# Patient Record
Sex: Female | Born: 1955
Health system: Southern US, Community
[De-identification: ages and names within clinical notes are randomized; demographics above are authoritative.]

## PROBLEM LIST (undated history)

## (undated) DIAGNOSIS — N201 Calculus of ureter: Secondary | ICD-10-CM

## (undated) DIAGNOSIS — M65331 Trigger finger, right middle finger: Secondary | ICD-10-CM

## (undated) DIAGNOSIS — R3915 Urgency of urination: Secondary | ICD-10-CM

## (undated) DIAGNOSIS — Z972 Presence of dental prosthetic device (complete) (partial): Secondary | ICD-10-CM

## (undated) DIAGNOSIS — F32A Depression, unspecified: Secondary | ICD-10-CM

## (undated) DIAGNOSIS — E785 Hyperlipidemia, unspecified: Secondary | ICD-10-CM

## (undated) DIAGNOSIS — F329 Major depressive disorder, single episode, unspecified: Secondary | ICD-10-CM

## (undated) DIAGNOSIS — Z9889 Other specified postprocedural states: Secondary | ICD-10-CM

## (undated) DIAGNOSIS — N200 Calculus of kidney: Secondary | ICD-10-CM

## (undated) DIAGNOSIS — M1990 Unspecified osteoarthritis, unspecified site: Secondary | ICD-10-CM

## (undated) DIAGNOSIS — R030 Elevated blood-pressure reading, without diagnosis of hypertension: Secondary | ICD-10-CM

## (undated) DIAGNOSIS — I10 Essential (primary) hypertension: Secondary | ICD-10-CM

## (undated) DIAGNOSIS — R112 Nausea with vomiting, unspecified: Secondary | ICD-10-CM

## (undated) DIAGNOSIS — M8580 Other specified disorders of bone density and structure, unspecified site: Secondary | ICD-10-CM

## (undated) DIAGNOSIS — N182 Chronic kidney disease, stage 2 (mild): Secondary | ICD-10-CM

## (undated) DIAGNOSIS — D649 Anemia, unspecified: Secondary | ICD-10-CM

## (undated) DIAGNOSIS — Z973 Presence of spectacles and contact lenses: Secondary | ICD-10-CM

## (undated) DIAGNOSIS — G5603 Carpal tunnel syndrome, bilateral upper limbs: Secondary | ICD-10-CM

## (undated) DIAGNOSIS — K5190 Ulcerative colitis, unspecified, without complications: Secondary | ICD-10-CM

## (undated) HISTORY — PX: OTHER SURGICAL HISTORY: SHX169

---

## 1976-10-17 HISTORY — PX: TUBAL LIGATION: SHX77

## 1999-04-26 ENCOUNTER — Other Ambulatory Visit: Admission: RE | Admit: 1999-04-26 | Discharge: 1999-04-26 | Payer: Self-pay | Admitting: Obstetrics and Gynecology

## 1999-06-09 ENCOUNTER — Other Ambulatory Visit: Admission: RE | Admit: 1999-06-09 | Discharge: 1999-06-09 | Payer: Self-pay | Admitting: Obstetrics and Gynecology

## 1999-06-30 ENCOUNTER — Ambulatory Visit (HOSPITAL_COMMUNITY): Admission: RE | Admit: 1999-06-30 | Discharge: 1999-06-30 | Payer: Self-pay | Admitting: Gastroenterology

## 2000-10-30 ENCOUNTER — Other Ambulatory Visit: Admission: RE | Admit: 2000-10-30 | Discharge: 2000-10-30 | Payer: Self-pay | Admitting: Obstetrics and Gynecology

## 2000-11-10 ENCOUNTER — Ambulatory Visit (HOSPITAL_COMMUNITY): Admission: RE | Admit: 2000-11-10 | Discharge: 2000-11-10 | Payer: Self-pay | Admitting: Gastroenterology

## 2002-02-20 ENCOUNTER — Other Ambulatory Visit: Admission: RE | Admit: 2002-02-20 | Discharge: 2002-02-20 | Payer: Self-pay | Admitting: Obstetrics & Gynecology

## 2003-05-20 ENCOUNTER — Other Ambulatory Visit: Admission: RE | Admit: 2003-05-20 | Discharge: 2003-05-20 | Payer: Self-pay | Admitting: Obstetrics & Gynecology

## 2003-10-18 HISTORY — PX: COLOSTOMY TAKEDOWN: SHX5783

## 2004-12-13 ENCOUNTER — Other Ambulatory Visit: Admission: RE | Admit: 2004-12-13 | Discharge: 2004-12-13 | Payer: Self-pay | Admitting: Obstetrics & Gynecology

## 2006-01-25 ENCOUNTER — Other Ambulatory Visit: Admission: RE | Admit: 2006-01-25 | Discharge: 2006-01-25 | Payer: Self-pay | Admitting: Obstetrics & Gynecology

## 2013-06-13 ENCOUNTER — Other Ambulatory Visit: Payer: Self-pay

## 2015-07-02 ENCOUNTER — Other Ambulatory Visit: Payer: Self-pay | Admitting: Obstetrics & Gynecology

## 2015-07-02 DIAGNOSIS — N6310 Unspecified lump in the right breast, unspecified quadrant: Secondary | ICD-10-CM

## 2015-07-07 ENCOUNTER — Ambulatory Visit
Admission: RE | Admit: 2015-07-07 | Discharge: 2015-07-07 | Disposition: A | Discharge status: Home / Self Care | Payer: 59 | Source: Ambulatory Visit | Attending: Obstetrics & Gynecology | Admitting: Obstetrics & Gynecology

## 2015-07-07 DIAGNOSIS — N6310 Unspecified lump in the right breast, unspecified quadrant: Secondary | ICD-10-CM

## 2016-05-10 ENCOUNTER — Emergency Department (HOSPITAL_BASED_OUTPATIENT_CLINIC_OR_DEPARTMENT_OTHER): Payer: 59

## 2016-05-10 ENCOUNTER — Encounter (HOSPITAL_BASED_OUTPATIENT_CLINIC_OR_DEPARTMENT_OTHER): Payer: Self-pay | Admitting: *Deleted

## 2016-05-10 ENCOUNTER — Inpatient Hospital Stay (HOSPITAL_BASED_OUTPATIENT_CLINIC_OR_DEPARTMENT_OTHER)
Admission: EM | Admit: 2016-05-10 | Discharge: 2016-05-18 | DRG: 357 | Disposition: A | Discharge status: Home / Self Care | Payer: 59 | Attending: Internal Medicine | Admitting: Internal Medicine

## 2016-05-10 DIAGNOSIS — N201 Calculus of ureter: Secondary | ICD-10-CM

## 2016-05-10 DIAGNOSIS — K567 Ileus, unspecified: Secondary | ICD-10-CM | POA: Diagnosis not present

## 2016-05-10 DIAGNOSIS — Z800 Family history of malignant neoplasm of digestive organs: Secondary | ICD-10-CM

## 2016-05-10 DIAGNOSIS — I129 Hypertensive chronic kidney disease with stage 1 through stage 4 chronic kidney disease, or unspecified chronic kidney disease: Secondary | ICD-10-CM | POA: Diagnosis present

## 2016-05-10 DIAGNOSIS — E785 Hyperlipidemia, unspecified: Secondary | ICD-10-CM | POA: Diagnosis present

## 2016-05-10 DIAGNOSIS — K562 Volvulus: Secondary | ICD-10-CM | POA: Diagnosis not present

## 2016-05-10 DIAGNOSIS — G5600 Carpal tunnel syndrome, unspecified upper limb: Secondary | ICD-10-CM | POA: Diagnosis present

## 2016-05-10 DIAGNOSIS — K5190 Ulcerative colitis, unspecified, without complications: Secondary | ICD-10-CM | POA: Diagnosis present

## 2016-05-10 DIAGNOSIS — K9189 Other postprocedural complications and disorders of digestive system: Secondary | ICD-10-CM | POA: Diagnosis not present

## 2016-05-10 DIAGNOSIS — N132 Hydronephrosis with renal and ureteral calculous obstruction: Secondary | ICD-10-CM | POA: Diagnosis present

## 2016-05-10 DIAGNOSIS — R1032 Left lower quadrant pain: Secondary | ICD-10-CM

## 2016-05-10 DIAGNOSIS — R109 Unspecified abdominal pain: Secondary | ICD-10-CM

## 2016-05-10 DIAGNOSIS — R1011 Right upper quadrant pain: Secondary | ICD-10-CM | POA: Diagnosis not present

## 2016-05-10 DIAGNOSIS — I10 Essential (primary) hypertension: Secondary | ICD-10-CM | POA: Diagnosis present

## 2016-05-10 DIAGNOSIS — Z79899 Other long term (current) drug therapy: Secondary | ICD-10-CM

## 2016-05-10 DIAGNOSIS — Z9049 Acquired absence of other specified parts of digestive tract: Secondary | ICD-10-CM

## 2016-05-10 DIAGNOSIS — M8580 Other specified disorders of bone density and structure, unspecified site: Secondary | ICD-10-CM | POA: Diagnosis present

## 2016-05-10 DIAGNOSIS — N211 Calculus in urethra: Secondary | ICD-10-CM | POA: Diagnosis present

## 2016-05-10 DIAGNOSIS — Z8719 Personal history of other diseases of the digestive system: Secondary | ICD-10-CM

## 2016-05-10 DIAGNOSIS — Z8049 Family history of malignant neoplasm of other genital organs: Secondary | ICD-10-CM

## 2016-05-10 DIAGNOSIS — F329 Major depressive disorder, single episode, unspecified: Secondary | ICD-10-CM | POA: Diagnosis present

## 2016-05-10 DIAGNOSIS — N182 Chronic kidney disease, stage 2 (mild): Secondary | ICD-10-CM | POA: Diagnosis present

## 2016-05-10 DIAGNOSIS — M7989 Other specified soft tissue disorders: Secondary | ICD-10-CM

## 2016-05-10 DIAGNOSIS — F419 Anxiety disorder, unspecified: Secondary | ICD-10-CM | POA: Diagnosis present

## 2016-05-10 HISTORY — DX: Major depressive disorder, single episode, unspecified: F32.9

## 2016-05-10 HISTORY — DX: Chronic kidney disease, stage 2 (mild): N18.2

## 2016-05-10 HISTORY — DX: Depression, unspecified: F32.A

## 2016-05-10 HISTORY — DX: Ulcerative colitis, unspecified, without complications: K51.90

## 2016-05-10 HISTORY — DX: Essential (primary) hypertension: I10

## 2016-05-10 HISTORY — DX: Other specified disorders of bone density and structure, unspecified site: M85.80

## 2016-05-10 LAB — CBC WITH DIFFERENTIAL/PLATELET
BASOS ABS: 0 10*3/uL (ref 0.0–0.1)
Basophils Relative: 0 %
EOS PCT: 0 %
Eosinophils Absolute: 0 10*3/uL (ref 0.0–0.7)
HCT: 40.4 % (ref 36.0–46.0)
Hemoglobin: 13.6 g/dL (ref 12.0–15.0)
LYMPHS PCT: 6 %
Lymphs Abs: 0.9 10*3/uL (ref 0.7–4.0)
MCH: 32 pg (ref 26.0–34.0)
MCHC: 33.7 g/dL (ref 30.0–36.0)
MCV: 95.1 fL (ref 78.0–100.0)
MONO ABS: 1.2 10*3/uL — AB (ref 0.1–1.0)
MONOS PCT: 9 %
Neutro Abs: 11.3 10*3/uL — ABNORMAL HIGH (ref 1.7–7.7)
Neutrophils Relative %: 85 %
PLATELETS: 224 10*3/uL (ref 150–400)
RBC: 4.25 MIL/uL (ref 3.87–5.11)
RDW: 13.4 % (ref 11.5–15.5)
WBC: 13.4 10*3/uL — ABNORMAL HIGH (ref 4.0–10.5)

## 2016-05-10 LAB — COMPREHENSIVE METABOLIC PANEL
ALT: 18 U/L (ref 14–54)
ANION GAP: 10 (ref 5–15)
AST: 25 U/L (ref 15–41)
Albumin: 4.2 g/dL (ref 3.5–5.0)
Alkaline Phosphatase: 97 U/L (ref 38–126)
BILIRUBIN TOTAL: 1.4 mg/dL — AB (ref 0.3–1.2)
BUN: 17 mg/dL (ref 6–20)
CHLORIDE: 102 mmol/L (ref 101–111)
CO2: 25 mmol/L (ref 22–32)
Calcium: 9.5 mg/dL (ref 8.9–10.3)
Creatinine, Ser: 1.37 mg/dL — ABNORMAL HIGH (ref 0.44–1.00)
GFR, EST AFRICAN AMERICAN: 48 mL/min — AB (ref >60)
GFR, EST NON AFRICAN AMERICAN: 41 mL/min — AB (ref >60)
Glucose, Bld: 121 mg/dL — ABNORMAL HIGH (ref 65–99)
POTASSIUM: 4.1 mmol/L (ref 3.5–5.1)
Sodium: 137 mmol/L (ref 135–145)
TOTAL PROTEIN: 8.4 g/dL — AB (ref 6.5–8.1)

## 2016-05-10 LAB — URINALYSIS, ROUTINE W REFLEX MICROSCOPIC
Bilirubin Urine: NEGATIVE
Glucose, UA: NEGATIVE mg/dL
KETONES UR: NEGATIVE mg/dL
NITRITE: NEGATIVE
PROTEIN: NEGATIVE mg/dL
Specific Gravity, Urine: 1.016 (ref 1.005–1.030)
pH: 5 (ref 5.0–8.0)

## 2016-05-10 LAB — URINE MICROSCOPIC-ADD ON

## 2016-05-10 LAB — LIPASE, BLOOD: LIPASE: 22 U/L (ref 11–51)

## 2016-05-10 LAB — I-STAT CG4 LACTIC ACID, ED: Lactic Acid, Venous: 0.49 mmol/L — ABNORMAL LOW (ref 0.5–1.9)

## 2016-05-10 MED ORDER — SODIUM CHLORIDE 0.9 % IV BOLUS (SEPSIS)
1000.0000 mL | Freq: Once | INTRAVENOUS | Status: AC
  Administered 2016-05-10: 1000 mL via INTRAVENOUS

## 2016-05-10 MED ORDER — MORPHINE SULFATE (PF) 4 MG/ML IV SOLN
4.0000 mg | INTRAVENOUS | Status: AC | PRN
  Administered 2016-05-10 (×3): 4 mg via INTRAVENOUS
  Filled 2016-05-10 (×3): qty 1

## 2016-05-10 MED ORDER — SODIUM CHLORIDE 0.9 % IV SOLN
Freq: Once | INTRAVENOUS | Status: AC
  Administered 2016-05-10: 19:00:00 via INTRAVENOUS

## 2016-05-10 MED ORDER — IOPAMIDOL (ISOVUE-300) INJECTION 61%
80.0000 mL | Freq: Once | INTRAVENOUS | Status: AC | PRN
  Administered 2016-05-10: 80 mL via INTRAVENOUS

## 2016-05-10 MED ORDER — HYDROMORPHONE HCL 1 MG/ML IJ SOLN
1.0000 mg | INTRAMUSCULAR | Status: DC | PRN
  Administered 2016-05-11: 1 mg via INTRAVENOUS
  Filled 2016-05-10: qty 1

## 2016-05-10 MED ORDER — ONDANSETRON HCL 4 MG/2ML IJ SOLN
4.0000 mg | Freq: Once | INTRAMUSCULAR | Status: AC
  Administered 2016-05-10: 4 mg via INTRAVENOUS
  Filled 2016-05-10: qty 2

## 2016-05-10 NOTE — ED Triage Notes (Addendum)
C/o abd pain, n/v since Sunday-NAD-steady gait-sent from [facility]CP-states she was sent to r/o bowel obstruction-last BM today

## 2016-05-10 NOTE — ED Provider Notes (Signed)
[place] DEPT [facility] Provider Note   CSN: [id number] Arrival date & time: 05/10/16  1739  First Provider Contact:  None    By signing my name below, I, Evelene Croon, attest that this documentation has been prepared under the direction and in the presence of Tanna Furry, MD . Electronically Signed: Evelene Croon, Scribe. 05/10/2016. 7:04 PM.  History   Chief Complaint Chief Complaint  Patient presents with  . Abdominal Pain    The history is provided by the patient. No language interpreter was used.    HPI Comments:  Leslie Harrison is a 60 y.o. female who presents to the Emergency Department complaining of waxing and waning abdominal pain which began 3 days ago; worsened today. Pt notes her pain began in the RUQ but has moved to her left mid abdomen today. Pt reports associated nausea and vomiting. She also noted low grade fever this AM with TMAX of 100.2. Pt denies blood in her stool and vomit Pt has a h/o Ulcerative colitis, h/o colectomy and a h/o  ileostomy and subsequent reversal. Pt states she hasn't had any issues since this was done in 2004. No alleviating factors noted. Pt was sent by her PCP at [facility] to [place]    Past Medical History:  Diagnosis Date  . Carpal tunnel syndrome   . Depression   . Hyperlipemia   . Hypertension   . Kidney disease, chronic, stage II (mild, EGFR 60+ ml/min)   . Osteopenia   . Ulcerative colitis ([place])     There are no active problems to display for this patient.   Past Surgical History:  Procedure Laterality Date  . ABDOMINOPERINEAL PROCTOCOLECTOMY    . COLECTOMY      OB History    No data available       Home Medications    Prior to Admission medications   Medication Sig Start Date End Date Taking? Authorizing Provider  atorvastatin (LIPITOR) 10 MG tablet Take 10 mg by mouth daily.   Yes Historical Provider, MD  Ergocalciferol 2500 units CAPS Take 5,000 Units by mouth 2 (two) times a week.   Yes Historical  Provider, MD  Multiple Vitamin (MULTIVITAMIN) capsule Take 1 capsule by mouth daily.   Yes Historical Provider, MD  sertraline (ZOLOFT) 100 MG tablet Take 150 mg by mouth daily.   Yes Historical Provider, MD  valsartan (DIOVAN) 160 MG tablet Take 160 mg by mouth daily.   Yes Historical Provider, MD    Family History No family history on file.  Social History Social History  Substance Use Topics  . Smoking status: Not on file  . Smokeless tobacco: Not on file  . Alcohol use Not on file     Allergies   Review of patient's allergies indicates no known allergies.   Review of Systems Review of Systems  Constitutional: Positive for fever. Negative for appetite change, chills, diaphoresis and fatigue.  HENT: Negative for mouth sores, sore throat and trouble swallowing.   Eyes: Negative for visual disturbance.  Respiratory: Negative for cough, chest tightness, shortness of breath and wheezing.   Cardiovascular: Negative for chest pain.  Gastrointestinal: Positive for abdominal pain, nausea and vomiting. Negative for abdominal distention, blood in stool and diarrhea.  Endocrine: Negative for polydipsia, polyphagia and polyuria.  Genitourinary: Negative for dysuria, frequency and hematuria.  Musculoskeletal: Negative for gait problem.  Skin: Negative for color change, pallor and rash.  Neurological: Negative for dizziness, syncope, light-headedness and headaches.  Hematological: Does not bruise/bleed  easily.  Psychiatric/Behavioral: Negative for behavioral problems and confusion.   Physical Exam Updated Vital Signs BP 104/70 (BP Location: Left Arm)   Pulse 92   Temp 98.5 F (36.9 C) (Oral)   Resp 16   Ht '4\' 11"'$  (1.499 m)   Wt 155 lb (70.3 kg)   SpO2 90%   BMI 31.31 kg/m   Physical Exam  Constitutional: She is oriented to person, place, and time. She appears well-developed and well-nourished. No distress.  HENT:  Head: Normocephalic.  Eyes: Conjunctivae are normal. Pupils  are equal, round, and reactive to light. No scleral icterus.  Neck: Normal range of motion. Neck supple. No thyromegaly present.  Cardiovascular: Normal rate and regular rhythm.  Exam reveals no gallop and no friction rub.   No murmur heard. Pulmonary/Chest: Effort normal and breath sounds normal. No respiratory distress. She has no wheezes. She has no rales.  Abdominal: Soft. She exhibits no distension. Bowel sounds are decreased. There is tenderness in the left lower quadrant. There is no rebound.  Markedly hypoactive bowel sounds.  Musculoskeletal: Normal range of motion.  Neurological: She is alert and oriented to person, place, and time.  Skin: Skin is warm and dry. No rash noted. No pallor.  Psychiatric: She has a normal mood and affect. Her behavior is normal.  Nursing note and vitals reviewed.  ED Treatments / Results   DIAGNOSTIC STUDIES:  Oxygen Saturation is 96% on RA, normal by my interpretation.    COORDINATION OF CARE:  6:23 PM Discussed treatment plan with pt at bedside and pt agreed to plan.  Labs (all labs ordered are listed, but only abnormal results are displayed) Labs Reviewed  CBC WITH DIFFERENTIAL/PLATELET - Abnormal; Notable for the following:       Result Value   WBC 13.4 (*)    Neutro Abs 11.3 (*)    Monocytes Absolute 1.2 (*)    All other components within normal limits  COMPREHENSIVE METABOLIC PANEL - Abnormal; Notable for the following:    Glucose, Bld 121 (*)    Creatinine, Ser 1.37 (*)    Total Protein 8.4 (*)    Total Bilirubin 1.4 (*)    GFR calc non Af Amer 41 (*)    GFR calc Af Amer 48 (*)    All other components within normal limits  URINALYSIS, ROUTINE W REFLEX MICROSCOPIC (NOT AT [facility]) - Abnormal; Notable for the following:    APPearance CLOUDY (*)    Hgb urine dipstick SMALL (*)    Leukocytes, UA SMALL (*)    All other components within normal limits  URINE MICROSCOPIC-ADD ON - Abnormal; Notable for the following:    Squamous  Epithelial / LPF 0-5 (*)    Bacteria, UA RARE (*)    All other components within normal limits  I-STAT CG4 LACTIC ACID, ED - Abnormal; Notable for the following:    Lactic Acid, Venous 0.49 (*)    All other components within normal limits  LIPASE, BLOOD    EKG  EKG Interpretation None       Radiology Ct Abdomen Pelvis W Contrast  Result Date: 05/10/2016 CLINICAL DATA:  Left-sided abdominal pain since Sunday now worse today. History of ulcerative colitis and prior colectomy. EXAM: CT ABDOMEN AND PELVIS WITH CONTRAST TECHNIQUE: Multidetector CT imaging of the abdomen and pelvis was performed using the standard protocol following bolus administration of intravenous contrast. CONTRAST:  31m ISOVUE-300 IOPAMIDOL (ISOVUE-300) INJECTION 61% COMPARISON:  None. FINDINGS: Lower chest: Limited visualization of the  lower thorax demonstrates minimal dependent subpleural ground-glass atelectasis, right greater than left. No focal airspace opacities. No pleural effusion. Normal heart size. Coronary artery calcifications. No pericardial effusion. Hepatobiliary: Normal hepatic contour. No discrete hepatic lesions. Normal appearance of the gallbladder given degree distention. No radiopaque gallstones. No intra extrahepatic biliary duct dilatation. No ascites. Pancreas: Normal appearance of the pancreas Spleen: Normal appearance of the spleen. Note is made of a small splenule. Adrenals/Urinary Tract: There is symmetric enhancement of the bilateral kidneys. Note is made of a punctate (approximately 0.5 x 0.7 cm stone within the distal aspect of the right ureter (axial image 68, series 2, coronal image 45, series 5) which results in moderate upstream ureterectasis and pelvicaliectasis. Note is made of an additional punctate (approximately 0.5 cm) nonobstructing stone within an upper/midpole right renal calyx (axial image 32, series 2). No evidence of left-sided nephrolithiasis urinary obstruction. Note is made of a  punctate (approximately 1 cm) hypo attenuating nonenhancing left-sided renal cyst. No discrete right-sided renal lesions. Normal appearance of the bilateral adrenal glands. Normal appearance of the urinary bladder given degree distention. Stomach/Bowel: Enteric contrast extensive the level of the rectum. Sequela of subtotal colectomy with patency of the ileal colonic anastomosis. An additional enteric anastomosis is seen within the left upper abdominal quadrant (axial image 46, series 2, coronal images 23 through 32, series 5). No evidence of enteric obstruction. No pneumoperitoneum, pneumatosis or portal venous gas. No discrete areas of bowel wall thickening. Vascular/Lymphatic: Moderate to large amount of mixed calcified and noncalcified atherosclerotic plaque within a normal caliber abdominal aorta. The major branch vessels of the abdominal aorta appear patent on this non CTA examination. There is apparent masslike hypertrophy of the fat within the left-side of the abdominal mesentery within the left lower abdominal quadrant which is ill-defined though measures approximately 13.0 x 6.3 x 12 cm (as measured in greatest oblique axial image 46, series 2 and coronal - image 22, series 5) and appears to results in mass effect upon the adjacent bowel. There are no definitive thickened internal septations within this abdominal mesenteries nor is there a definitive swirling of the mesenteric vessels. No bulky retroperitoneal mesenteric, pelvic or inguinal lymphadenopathy. Reproductive: Normal appearance of the pelvic organs. No discrete adnexal lesion. No free fluid the pelvic cul-de-sac. Other: Tiny mesenteric fat containing periumbilical hernia. Musculoskeletal: No acute or aggressive osseous abnormalities. IMPRESSION: 1. Punctate (approximately 7 mm) stone within the distal aspect the right ureter results in moderate upstream ureterectasis and pelvicaliectasis. 2. Solitary additional punctate (approximately 5 mm)  nonobstructing right-sided renal stone. No evidence of left-sided nephrolithiasis urinary obstruction. 3. Sequela of subtotal colectomy with additional enteric anastomosis within the right upper abdominal quadrant. No evidence of enteric obstruction at either of these locations. 4. Masslike hypertrophy of the fat within the left-side of the abdominal mesentery which results in mass effect upon the adjacent bowel though again does not result in enteric obstruction. Differential considerations are broad and include an intraperitoneal mesenteric fat containing hernia though a discrete fat containing lesion (lipoma/liposarcoma) cannot be entirely excluded on the basis of this examination. Comparison with prior outside examinations is recommended. Critical Value/emergent results were called by telephone at the time of interpretation on 05/10/2016 at 9:26 pm to Dr. Tanna Furry , who verbally acknowledged these results. Electronically Signed   By: Sandi Mariscal M.D.   On: 05/10/2016 21:29   Procedures Procedures   Medications Ordered in ED Medications  HYDROmorphone (DILAUDID) injection 1 mg (not administered)  ondansetron (ZOFRAN)  injection 4 mg (4 mg Intravenous Given 05/10/16 1851)  morphine 4 MG/ML injection 4 mg (4 mg Intravenous Given 05/10/16 2212)  0.9 %  sodium chloride infusion ( Intravenous Stopped 05/10/16 2040)  iopamidol (ISOVUE-300) 61 % injection 80 mL (80 mLs Intravenous Contrast Given 05/10/16 2055)  sodium chloride 0.9 % bolus 1,000 mL (1,000 mLs Intravenous New Bag/Given 05/10/16 2155)    Initial Impression / Assessment and Plan / ED Course  I have reviewed the triage vital signs and the nursing notes.  Pertinent labs & imaging results that were available during my care of the patient were reviewed by me and considered in my medical decision making (see chart for details).  Clinical Course    Unusual CT findings discussed at length with the patient. She has no symptoms related to her  right flank and lower abdomen to suggest that her symptoms are related to ureteral colic. She does have a 7 mm UVJ stone with high. Does not have infected or grossly bloody urine. Minimal cells. Has white count of 13,000 and large inflammatory mass or left mesentery without sign of obvious obstruction although there is mass effect on the otherwise normal caliber small bowel. She surgically absent her colon. Discussed the case with Dr. Redmond Pulling he is reviewed imaging he will see the patient in consult. Care discussed with Dr. Prescott Parma candy he will accept the patient in transfer. An polyp forms completed. All patient's questions were answered. She is currently stable hemodynamically and symptom free with pain medication.  Final Clinical Impressions(s) / ED Diagnoses   Final diagnoses:  Left lower quadrant pain  Ureteral stone    New Prescriptions New Prescriptions   No medications on file   I personally performed the services described in this documentation, which was scribed in my presence. The recorded information has been reviewed and is accurate.     Tanna Furry, MD 05/10/16 [phone]

## 2016-05-11 ENCOUNTER — Observation Stay (HOSPITAL_COMMUNITY): Payer: 59

## 2016-05-11 ENCOUNTER — Encounter (HOSPITAL_COMMUNITY): Payer: Self-pay | Admitting: *Deleted

## 2016-05-11 DIAGNOSIS — N201 Calculus of ureter: Secondary | ICD-10-CM | POA: Diagnosis not present

## 2016-05-11 DIAGNOSIS — R1011 Right upper quadrant pain: Secondary | ICD-10-CM | POA: Diagnosis present

## 2016-05-11 DIAGNOSIS — Z800 Family history of malignant neoplasm of digestive organs: Secondary | ICD-10-CM | POA: Diagnosis not present

## 2016-05-11 DIAGNOSIS — E785 Hyperlipidemia, unspecified: Secondary | ICD-10-CM | POA: Diagnosis present

## 2016-05-11 DIAGNOSIS — F329 Major depressive disorder, single episode, unspecified: Secondary | ICD-10-CM | POA: Diagnosis present

## 2016-05-11 DIAGNOSIS — N132 Hydronephrosis with renal and ureteral calculous obstruction: Secondary | ICD-10-CM | POA: Diagnosis present

## 2016-05-11 DIAGNOSIS — K567 Ileus, unspecified: Secondary | ICD-10-CM | POA: Diagnosis not present

## 2016-05-11 DIAGNOSIS — M8580 Other specified disorders of bone density and structure, unspecified site: Secondary | ICD-10-CM | POA: Diagnosis present

## 2016-05-11 DIAGNOSIS — N211 Calculus in urethra: Secondary | ICD-10-CM | POA: Diagnosis present

## 2016-05-11 DIAGNOSIS — R1012 Left upper quadrant pain: Secondary | ICD-10-CM | POA: Diagnosis not present

## 2016-05-11 DIAGNOSIS — I129 Hypertensive chronic kidney disease with stage 1 through stage 4 chronic kidney disease, or unspecified chronic kidney disease: Secondary | ICD-10-CM | POA: Diagnosis present

## 2016-05-11 DIAGNOSIS — Z9049 Acquired absence of other specified parts of digestive tract: Secondary | ICD-10-CM | POA: Diagnosis not present

## 2016-05-11 DIAGNOSIS — I10 Essential (primary) hypertension: Secondary | ICD-10-CM | POA: Diagnosis not present

## 2016-05-11 DIAGNOSIS — R109 Unspecified abdominal pain: Secondary | ICD-10-CM | POA: Diagnosis present

## 2016-05-11 DIAGNOSIS — G5600 Carpal tunnel syndrome, unspecified upper limb: Secondary | ICD-10-CM | POA: Diagnosis present

## 2016-05-11 DIAGNOSIS — Z8049 Family history of malignant neoplasm of other genital organs: Secondary | ICD-10-CM | POA: Diagnosis not present

## 2016-05-11 DIAGNOSIS — N182 Chronic kidney disease, stage 2 (mild): Secondary | ICD-10-CM | POA: Diagnosis present

## 2016-05-11 DIAGNOSIS — K562 Volvulus: Secondary | ICD-10-CM | POA: Diagnosis present

## 2016-05-11 DIAGNOSIS — K9189 Other postprocedural complications and disorders of digestive system: Secondary | ICD-10-CM | POA: Diagnosis not present

## 2016-05-11 DIAGNOSIS — Z8719 Personal history of other diseases of the digestive system: Secondary | ICD-10-CM

## 2016-05-11 DIAGNOSIS — F419 Anxiety disorder, unspecified: Secondary | ICD-10-CM | POA: Diagnosis present

## 2016-05-11 DIAGNOSIS — K5190 Ulcerative colitis, unspecified, without complications: Secondary | ICD-10-CM | POA: Diagnosis present

## 2016-05-11 DIAGNOSIS — Z79899 Other long term (current) drug therapy: Secondary | ICD-10-CM | POA: Diagnosis not present

## 2016-05-11 LAB — COMPREHENSIVE METABOLIC PANEL
ALBUMIN: 3.6 g/dL (ref 3.5–5.0)
ALK PHOS: 78 U/L (ref 38–126)
ALT: 16 U/L (ref 14–54)
ANION GAP: 6 (ref 5–15)
AST: 20 U/L (ref 15–41)
BILIRUBIN TOTAL: 1.5 mg/dL — AB (ref 0.3–1.2)
BUN: 15 mg/dL (ref 6–20)
CALCIUM: 8.3 mg/dL — AB (ref 8.9–10.3)
CO2: 26 mmol/L (ref 22–32)
Chloride: 106 mmol/L (ref 101–111)
Creatinine, Ser: 1.12 mg/dL — ABNORMAL HIGH (ref 0.44–1.00)
GFR calc Af Amer: >60 mL/min (ref >60)
GFR, EST NON AFRICAN AMERICAN: 53 mL/min — AB (ref >60)
GLUCOSE: 131 mg/dL — AB (ref 65–99)
Potassium: 3.9 mmol/L (ref 3.5–5.1)
Sodium: 138 mmol/L (ref 135–145)
TOTAL PROTEIN: 7.1 g/dL (ref 6.5–8.1)

## 2016-05-11 LAB — GLUCOSE, CAPILLARY
GLUCOSE-CAPILLARY: 113 mg/dL — AB (ref 65–99)
Glucose-Capillary: 109 mg/dL — ABNORMAL HIGH (ref 65–99)
Glucose-Capillary: 91 mg/dL (ref 65–99)

## 2016-05-11 LAB — CBC WITH DIFFERENTIAL/PLATELET
BASOS PCT: 0 %
Basophils Absolute: 0 10*3/uL (ref 0.0–0.1)
Eosinophils Absolute: 0 10*3/uL (ref 0.0–0.7)
Eosinophils Relative: 0 %
HEMATOCRIT: 34.9 % — AB (ref 36.0–46.0)
Hemoglobin: 11.7 g/dL — ABNORMAL LOW (ref 12.0–15.0)
Lymphocytes Relative: 6 %
Lymphs Abs: 0.7 10*3/uL (ref 0.7–4.0)
MCH: 32.1 pg (ref 26.0–34.0)
MCHC: 33.5 g/dL (ref 30.0–36.0)
MCV: 95.6 fL (ref 78.0–100.0)
MONO ABS: 1.2 10*3/uL — AB (ref 0.1–1.0)
MONOS PCT: 10 %
NEUTROS ABS: 10.3 10*3/uL — AB (ref 1.7–7.7)
Neutrophils Relative %: 84 %
Platelets: 193 10*3/uL (ref 150–400)
RBC: 3.65 MIL/uL — ABNORMAL LOW (ref 3.87–5.11)
RDW: 13.7 % (ref 11.5–15.5)
WBC: 12.2 10*3/uL — ABNORMAL HIGH (ref 4.0–10.5)

## 2016-05-11 LAB — TYPE AND SCREEN
ABO/RH(D): AB POS
ANTIBODY SCREEN: NEGATIVE

## 2016-05-11 LAB — ABO/RH: ABO/RH(D): AB POS

## 2016-05-11 MED ORDER — ACETAMINOPHEN 325 MG PO TABS: 650.0000 mg | ORAL_TABLET | Freq: Four times a day (QID) | ORAL | Status: DC | PRN

## 2016-05-11 MED ORDER — ONDANSETRON HCL 4 MG/2ML IJ SOLN: 4.0000 mg | Freq: Four times a day (QID) | INTRAMUSCULAR | Status: DC | PRN

## 2016-05-11 MED ORDER — ACETAMINOPHEN 650 MG RE SUPP: 650.0000 mg | Freq: Four times a day (QID) | RECTAL | Status: DC | PRN

## 2016-05-11 MED ORDER — HYDROMORPHONE HCL 1 MG/ML IJ SOLN
0.5000 mg | INTRAMUSCULAR | Status: DC | PRN
  Administered 2016-05-11 – 2016-05-12 (×7): 0.5 mg via INTRAVENOUS
  Filled 2016-05-11 (×8): qty 1

## 2016-05-11 MED ORDER — ONDANSETRON HCL 4 MG PO TABS: 4.0000 mg | ORAL_TABLET | Freq: Four times a day (QID) | ORAL | Status: DC | PRN

## 2016-05-11 MED ORDER — AMOXICILLIN-POT CLAVULANATE 875-125 MG PO TABS
1.0000 | ORAL_TABLET | Freq: Two times a day (BID) | ORAL | Status: DC
  Filled 2016-05-11 (×2): qty 1

## 2016-05-11 MED ORDER — GADOBENATE DIMEGLUMINE 529 MG/ML IV SOLN
15.0000 mL | Freq: Once | INTRAVENOUS | Status: AC | PRN
  Administered 2016-05-11: 15 mL via INTRAVENOUS

## 2016-05-11 MED ORDER — HYDRALAZINE HCL 20 MG/ML IJ SOLN: 10.0000 mg | INTRAMUSCULAR | Status: DC | PRN

## 2016-05-11 MED ORDER — DEXTROSE-NACL 5-0.9 % IV SOLN
INTRAVENOUS | Status: DC
  Administered 2016-05-11: 17:00:00 via INTRAVENOUS
  Administered 2016-05-11: 1000 mL via INTRAVENOUS

## 2016-05-11 NOTE — Consult Note (Signed)
Urology Consult   Physician requesting consult: Dr. Gean Birchwood  Reason for consult: right ureteral stone  History of Present Illness: Leslie Harrison is a 60 y.o. with history of ulcerative colitis status post colectomy in 2005, hypertension and hyperlipidemia who was admitted overnight with 3-4 days of left abdominal pain. She denies right abdominal/flank/back pain. She did have one episode of emesis yesterday. Denies fevers, chills. CT showed a left mesenteric mass and right 4x59m mid ureteral stone with moderate right hydronephrosis. On Admission had WBC of 13, now 12 this AM. Creatinine was elevated at 1.37 from unknown baseline but improved to 1.1 with hydration. UA was not very concerning for infection with rare bacteria, small LE, negative nitrite.   She denies a history of voiding or storage urinary symptoms, hematuria, UTIs, urolithiasis, GU malignancy/trauma/surgery.  Past Medical History:  Diagnosis Date  . Carpal tunnel syndrome   . Depression   . Hyperlipemia   . Hypertension   . Kidney disease, chronic, stage II (mild, EGFR 60+ ml/min)   . Osteopenia   . Ulcerative colitis ([facility]     Past Surgical History:  Procedure Laterality Date  . ABDOMINOPERINEAL PROCTOCOLECTOMY    . COLECTOMY       Current Hospital Medications:  Home meds:    Medication List    ASK your doctor about these medications   atorvastatin 10 MG tablet Commonly known as:  LIPITOR Take 10 mg by mouth daily.   calcium carbonate 1250 (500 Ca) MG tablet Commonly known as:  OS-CAL - dosed in mg of elemental calcium Take 1 tablet by mouth daily with breakfast.   multivitamin capsule Take 1 capsule by mouth daily.   sertraline 100 MG tablet Commonly known as:  ZOLOFT Take 150 mg by mouth daily.   valsartan 160 MG tablet Commonly known as:  DIOVAN Take 160 mg by mouth daily.       Scheduled Meds:  Continuous Infusions: . dextrose 5 % and 0.9% NaCl 1,000 mL (05/11/16 0328)   PRN  Meds:.acetaminophen **OR** acetaminophen, hydrALAZINE, HYDROmorphone (DILAUDID) injection, ondansetron **OR** ondansetron (ZOFRAN) IV  Allergies: No Known Allergies  Family History  Problem Relation Age of Onset  . Rectal cancer Father   . Ulcerative colitis Father   . Cervical cancer Maternal Grandmother     Social History:  reports that she has never smoked. She has never used smokeless tobacco. She reports that she does not drink alcohol or use drugs.  ROS: A complete review of systems was performed.  All systems are negative except for pertinent findings as noted.  Physical Exam:  Vital signs in last 24 hours: Temp:  [98.2 F (36.8 C)-98.7 F (37.1 C)] 98.5 F (36.9 C) (07/26 0635) Pulse Rate:  [83-99] 87 (07/26 0635) Resp:  [16-18] 18 (07/26 0635) BP: (104-146)/(61-85) 122/85 (07/26 0635) SpO2:  [90 %-100 %] 96 % (07/26 0635) FiO2 (%):  [2 %] 2 % (07/26 0635) Weight:  [70.3 kg (155 lb)] 70.3 kg (155 lb) (07/25 1748) Constitutional:  Alert and oriented, No acute distress Cardiovascular: Regular rate and rhythm  Respiratory: Normal respiratory effort  GI: Abdomen is soft, tender to palpation in LLQ, nondistended, no abdominal masses GU: No CVA tenderness bilaterally  Neurologic: Grossly intact, no focal deficits Psychiatric: Normal mood and affect  Laboratory Data:   Recent Labs  05/10/16 1847 05/11/16 0311  WBC 13.4* 12.2*  HGB 13.6 11.7*  HCT 40.4 34.9*  PLT 224 193     Recent Labs  05/10/16  1847 05/11/16 0311  NA 137 138  K 4.1 3.9  CL 102 106  GLUCOSE 121* 131*  BUN 17 15  CALCIUM 9.5 8.3*  CREATININE 1.37* 1.12*     Results for orders placed or performed during the hospital encounter of 05/10/16 (from the past 24 hour(s))  CBC with Differential/Platelet     Status: Abnormal   Collection Time: 05/10/16  6:47 PM  Result Value Ref Range   WBC 13.4 (H) 4.0 - 10.5 K/uL   RBC 4.25 3.87 - 5.11 MIL/uL   Hemoglobin 13.6 12.0 - 15.0 g/dL   HCT 40.4  36.0 - 46.0 %   MCV 95.1 78.0 - 100.0 fL   MCH 32.0 26.0 - 34.0 pg   MCHC 33.7 30.0 - 36.0 g/dL   RDW 13.4 11.5 - 15.5 %   Platelets 224 150 - 400 K/uL   Neutrophils Relative % 85 %   Neutro Abs 11.3 (H) 1.7 - 7.7 K/uL   Lymphocytes Relative 6 %   Lymphs Abs 0.9 0.7 - 4.0 K/uL   Monocytes Relative 9 %   Monocytes Absolute 1.2 (H) 0.1 - 1.0 K/uL   Eosinophils Relative 0 %   Eosinophils Absolute 0.0 0.0 - 0.7 K/uL   Basophils Relative 0 %   Basophils Absolute 0.0 0.0 - 0.1 K/uL  Comprehensive metabolic panel     Status: Abnormal   Collection Time: 05/10/16  6:47 PM  Result Value Ref Range   Sodium 137 135 - 145 mmol/L   Potassium 4.1 3.5 - 5.1 mmol/L   Chloride 102 101 - 111 mmol/L   CO2 25 22 - 32 mmol/L   Glucose, Bld 121 (H) 65 - 99 mg/dL   BUN 17 6 - 20 mg/dL   Creatinine, Ser 1.37 (H) 0.44 - 1.00 mg/dL   Calcium 9.5 8.9 - 10.3 mg/dL   Total Protein 8.4 (H) 6.5 - 8.1 g/dL   Albumin 4.2 3.5 - 5.0 g/dL   AST 25 15 - 41 U/L   ALT 18 14 - 54 U/L   Alkaline Phosphatase 97 38 - 126 U/L   Total Bilirubin 1.4 (H) 0.3 - 1.2 mg/dL   GFR calc non Af Amer 41 (L) >60 mL/min   GFR calc Af Amer 48 (L) >60 mL/min   Anion gap 10 5 - 15  Lipase, blood     Status: None   Collection Time: 05/10/16  6:47 PM  Result Value Ref Range   Lipase 22 11 - 51 U/L  Urinalysis, Routine w reflex microscopic (not at [facility])     Status: Abnormal   Collection Time: 05/10/16  7:35 PM  Result Value Ref Range   Color, Urine YELLOW YELLOW   APPearance CLOUDY (A) CLEAR   Specific Gravity, Urine 1.016 1.005 - 1.030   pH 5.0 5.0 - 8.0   Glucose, UA NEGATIVE NEGATIVE mg/dL   Hgb urine dipstick SMALL (A) NEGATIVE   Bilirubin Urine NEGATIVE NEGATIVE   Ketones, ur NEGATIVE NEGATIVE mg/dL   Protein, ur NEGATIVE NEGATIVE mg/dL   Nitrite NEGATIVE NEGATIVE   Leukocytes, UA SMALL (A) NEGATIVE  Urine microscopic-add on     Status: Abnormal   Collection Time: 05/10/16  7:35 PM  Result Value Ref Range   Squamous  Epithelial / LPF 0-5 (A) NONE SEEN   WBC, UA 0-5 0 - 5 WBC/hpf   RBC / HPF 0-5 0 - 5 RBC/hpf   Bacteria, UA RARE (A) NONE SEEN   Urine-Other MUCOUS PRESENT  I-Stat CG4 Lactic Acid, ED     Status: Abnormal   Collection Time: 05/10/16  9:52 PM  Result Value Ref Range   Lactic Acid, Venous 0.49 (L) 0.5 - 1.9 mmol/L  Type and screen [place]     Status: None   Collection Time: 05/11/16  3:11 AM  Result Value Ref Range   ABO/RH(D) AB POS    Antibody Screen NEG    Sample Expiration 05/14/2016   Comprehensive metabolic panel     Status: Abnormal   Collection Time: 05/11/16  3:11 AM  Result Value Ref Range   Sodium 138 135 - 145 mmol/L   Potassium 3.9 3.5 - 5.1 mmol/L   Chloride 106 101 - 111 mmol/L   CO2 26 22 - 32 mmol/L   Glucose, Bld 131 (H) 65 - 99 mg/dL   BUN 15 6 - 20 mg/dL   Creatinine, Ser 1.12 (H) 0.44 - 1.00 mg/dL   Calcium 8.3 (L) 8.9 - 10.3 mg/dL   Total Protein 7.1 6.5 - 8.1 g/dL   Albumin 3.6 3.5 - 5.0 g/dL   AST 20 15 - 41 U/L   ALT 16 14 - 54 U/L   Alkaline Phosphatase 78 38 - 126 U/L   Total Bilirubin 1.5 (H) 0.3 - 1.2 mg/dL   GFR calc non Af Amer 53 (L) >60 mL/min   GFR calc Af Amer >60 >60 mL/min   Anion gap 6 5 - 15  CBC WITH DIFFERENTIAL     Status: Abnormal   Collection Time: 05/11/16  3:11 AM  Result Value Ref Range   WBC 12.2 (H) 4.0 - 10.5 K/uL   RBC 3.65 (L) 3.87 - 5.11 MIL/uL   Hemoglobin 11.7 (L) 12.0 - 15.0 g/dL   HCT 34.9 (L) 36.0 - 46.0 %   MCV 95.6 78.0 - 100.0 fL   MCH 32.1 26.0 - 34.0 pg   MCHC 33.5 30.0 - 36.0 g/dL   RDW 13.7 11.5 - 15.5 %   Platelets 193 150 - 400 K/uL   Neutrophils Relative % 84 %   Neutro Abs 10.3 (H) 1.7 - 7.7 K/uL   Lymphocytes Relative 6 %   Lymphs Abs 0.7 0.7 - 4.0 K/uL   Monocytes Relative 10 %   Monocytes Absolute 1.2 (H) 0.1 - 1.0 K/uL   Eosinophils Relative 0 %   Eosinophils Absolute 0.0 0.0 - 0.7 K/uL   Basophils Relative 0 %   Basophils Absolute 0.0 0.0 - 0.1 K/uL  ABO/Rh      Status: None   Collection Time: 05/11/16  3:11 AM  Result Value Ref Range   ABO/RH(D) AB POS   Glucose, capillary     Status: Abnormal   Collection Time: 05/11/16  7:43 AM  Result Value Ref Range   Glucose-Capillary 113 (H) 65 - 99 mg/dL   No results found for this or any previous visit (from the past 240 hour(s)).  Renal Function:  Recent Labs  05/10/16 1847 05/11/16 0311  CREATININE 1.37* 1.12*   Estimated Creatinine Clearance: 46.1 mL/min (by C-G formula based on SCr of 1.12 mg/dL).  Radiologic Imaging: Ct Abdomen Pelvis W Contrast  Result Date: 05/10/2016 CLINICAL DATA:  Left-sided abdominal pain since Sunday now worse today. History of ulcerative colitis and prior colectomy. EXAM: CT ABDOMEN AND PELVIS WITH CONTRAST TECHNIQUE: Multidetector CT imaging of the abdomen and pelvis was performed using the standard protocol following bolus administration of intravenous contrast. CONTRAST:  61m ISOVUE-300 IOPAMIDOL (ISOVUE-300) INJECTION  61% COMPARISON:  None. FINDINGS: Lower chest: Limited visualization of the lower thorax demonstrates minimal dependent subpleural ground-glass atelectasis, right greater than left. No focal airspace opacities. No pleural effusion. Normal heart size. Coronary artery calcifications. No pericardial effusion. Hepatobiliary: Normal hepatic contour. No discrete hepatic lesions. Normal appearance of the gallbladder given degree distention. No radiopaque gallstones. No intra extrahepatic biliary duct dilatation. No ascites. Pancreas: Normal appearance of the pancreas Spleen: Normal appearance of the spleen. Note is made of a small splenule. Adrenals/Urinary Tract: There is symmetric enhancement of the bilateral kidneys. Note is made of a punctate (approximately 0.5 x 0.7 cm stone within the distal aspect of the right ureter (axial image 68, series 2, coronal image 45, series 5) which results in moderate upstream ureterectasis and pelvicaliectasis. Note is made of an  additional punctate (approximately 0.5 cm) nonobstructing stone within an upper/midpole right renal calyx (axial image 32, series 2). No evidence of left-sided nephrolithiasis urinary obstruction. Note is made of a punctate (approximately 1 cm) hypo attenuating nonenhancing left-sided renal cyst. No discrete right-sided renal lesions. Normal appearance of the bilateral adrenal glands. Normal appearance of the urinary bladder given degree distention. Stomach/Bowel: Enteric contrast extensive the level of the rectum. Sequela of subtotal colectomy with patency of the ileal colonic anastomosis. An additional enteric anastomosis is seen within the left upper abdominal quadrant (axial image 46, series 2, coronal images 23 through 32, series 5). No evidence of enteric obstruction. No pneumoperitoneum, pneumatosis or portal venous gas. No discrete areas of bowel wall thickening. Vascular/Lymphatic: Moderate to large amount of mixed calcified and noncalcified atherosclerotic plaque within a normal caliber abdominal aorta. The major branch vessels of the abdominal aorta appear patent on this non CTA examination. There is apparent masslike hypertrophy of the fat within the left-side of the abdominal mesentery within the left lower abdominal quadrant which is ill-defined though measures approximately 13.0 x 6.3 x 12 cm (as measured in greatest oblique axial image 46, series 2 and coronal - image 22, series 5) and appears to results in mass effect upon the adjacent bowel. There are no definitive thickened internal septations within this abdominal mesenteries nor is there a definitive swirling of the mesenteric vessels. No bulky retroperitoneal mesenteric, pelvic or inguinal lymphadenopathy. Reproductive: Normal appearance of the pelvic organs. No discrete adnexal lesion. No free fluid the pelvic cul-de-sac. Other: Tiny mesenteric fat containing periumbilical hernia. Musculoskeletal: No acute or aggressive osseous abnormalities.  IMPRESSION: 1. Punctate (approximately 7 mm) stone within the distal aspect the right ureter results in moderate upstream ureterectasis and pelvicaliectasis. 2. Solitary additional punctate (approximately 5 mm) nonobstructing right-sided renal stone. No evidence of left-sided nephrolithiasis urinary obstruction. 3. Sequela of subtotal colectomy with additional enteric anastomosis within the right upper abdominal quadrant. No evidence of enteric obstruction at either of these locations. 4. Masslike hypertrophy of the fat within the left-side of the abdominal mesentery which results in mass effect upon the adjacent bowel though again does not result in enteric obstruction. Differential considerations are broad and include an intraperitoneal mesenteric fat containing hernia though a discrete fat containing lesion (lipoma/liposarcoma) cannot be entirely excluded on the basis of this examination. Comparison with prior outside examinations is recommended. Critical Value/emergent results were called by telephone at the time of interpretation on 05/10/2016 at 9:26 pm to Dr. Tanna Furry , who verbally acknowledged these results. Electronically Signed   By: Sandi Mariscal M.D.   On: 05/10/2016 21:29   I independently reviewed the above imaging studies.  Impression/Recommendation:  60 year old female with history of ulcerative colitis status post colectomy found to have 56m mid right ureteral stone and left mesenteric mass. She appears relatively asymptomatic from her stone with no obvious evidence of infection. We discussed the natural history of urolithiasis and the options of medical expulsive therapy vs intervention with stent placement or nephrostomy tube. She does prefer a trial of medical expulsive therapy prior to any intervention, and I quoted her a 40-50% chance of passage.  Treatment options including ureteroscopy and shock wave lithotripsy were discussed and she would prefer ureteroscopy if unable to pass the  stone.  - Please send urine culture to rule out UTI - Recommend flomax 0.470mnightly to assist with stone passage - Patient will need to follow up with urology in 1-2 weeks with either Dr. HeLouis Meckelr Dr. McAlyson Ingles Patient has never had a stone and favors u scope if needed I [facility] to [facility] and send home with flomax I performed a history and physical examination of the patient and discussed his management with the resident.  I reviewed the resident's note and agree with the documented findings and plan of care      TrLolita Rieger/26/2017, 7:58 AM

## 2016-05-11 NOTE — H&P (Signed)
History and Physical    Leslie Harrison XIH:[id number] DOB: 1956/03/15 DOA: 05/10/2016  PCP: Wynelle Fanny  Patient coming from: Home.  Chief Complaint: Abdominal pain.  HPI: Leslie Harrison is a 60 y.o. female with history of ulcerative colitis status post colectomy, hypertension and hyperlipidemia started experiencing abdominal pain 3-4 days ago. Patient's pain was initially diffuse which became more focused on the left side eventually. Has had one episode of nausea vomiting yesterday. Has had decreased bowel movements last few days which is unusual for her. In the ER CT abdomen shows right ureteric stone and mass in the left side. On calls general surgeon Dr. Redmond Pulling was consulted and patient is being admitted for further management. On exam patient is not in distress. Pain improved with IV narcotics.  ED Course: CT abdomen and pelvis was done which showed left-sided mass and right ureteric stone.  Review of Systems: As per HPI, rest all negative.   Past Medical History:  Diagnosis Date  . Carpal tunnel syndrome   . Depression   . Hyperlipemia   . Hypertension   . Kidney disease, chronic, stage II (mild, EGFR 60+ ml/min)   . Osteopenia   . Ulcerative colitis [facility])     Past Surgical History:  Procedure Laterality Date  . ABDOMINOPERINEAL PROCTOCOLECTOMY    . COLECTOMY       reports that she has never smoked. She has never used smokeless tobacco. She reports that she does not drink alcohol or use drugs.  No Known Allergies  Family History  Problem Relation Age of Onset  . Rectal cancer Father   . Ulcerative colitis Father   . Cervical cancer Maternal Grandmother     Prior to Admission medications   Medication Sig Start Date End Date Taking? Authorizing Provider  atorvastatin (LIPITOR) 10 MG tablet Take 10 mg by mouth daily.   Yes Historical Provider, MD  calcium carbonate (OS-CAL - DOSED IN MG OF ELEMENTAL CALCIUM) 1250 (500 Ca) MG tablet Take 1 tablet by mouth  daily with breakfast.   Yes Historical Provider, MD  Multiple Vitamin (MULTIVITAMIN) capsule Take 1 capsule by mouth daily.   Yes Historical Provider, MD  sertraline (ZOLOFT) 100 MG tablet Take 150 mg by mouth daily.   Yes Historical Provider, MD  valsartan (DIOVAN) 160 MG tablet Take 160 mg by mouth daily.   Yes Historical Provider, MD    Physical Exam: Vitals:   05/10/16 2111 05/10/16 2316 05/10/16 2351 05/11/16 0110  BP: 120/66 104/70 118/61 110/61  Pulse: 97 92 94 83  Resp: 18 16 18 17   Temp:   98.7 F (37.1 C) 98.2 F (36.8 C)  TempSrc:   Oral Oral  SpO2: 96% 90% 94% 92%  Weight:      Height:          Constitutional: Not in distress. Vitals:   05/10/16 2111 05/10/16 2316 05/10/16 2351 05/11/16 0110  BP: 120/66 104/70 118/61 110/61  Pulse: 97 92 94 83  Resp: 18 16 18 17   Temp:   98.7 F (37.1 C) 98.2 F (36.8 C)  TempSrc:   Oral Oral  SpO2: 96% 90% 94% 92%  Weight:      Height:       Eyes: Anicteric no pallor. ENMT: No discharge from the ears eyes nose and mouth. Neck: No JVD appreciated no mass felt. Respiratory: No rhonchi or crepitations. Cardiovascular: S1 and S2 heard. Abdomen: Soft mildly distended no guarding or rigidity. Musculoskeletal: No edema. Skin: No  rash. Neurologic: Alert awake oriented to time place and person. Moves all extremities. Psychiatric: Appears normal.   Labs on Admission: I have personally reviewed following labs and imaging studies  CBC:  Recent Labs Lab 05/10/16 1847  WBC 13.4*  NEUTROABS 11.3*  HGB 13.6  HCT 40.4  MCV 95.1  PLT 569   Basic Metabolic Panel:  Recent Labs Lab 05/10/16 1847  NA 137  K 4.1  CL 102  CO2 25  GLUCOSE 121*  BUN 17  CREATININE 1.37*  CALCIUM 9.5   GFR: Estimated Creatinine Clearance: 37.7 mL/min (by C-G formula based on SCr of 1.37 mg/dL). Liver Function Tests:  Recent Labs Lab 05/10/16 1847  AST 25  ALT 18  ALKPHOS 97  BILITOT 1.4*  PROT 8.4*  ALBUMIN 4.2    Recent  Labs Lab 05/10/16 1847  LIPASE 22   No results for input(s): AMMONIA in the last 168 hours. Coagulation Profile: No results for input(s): INR, PROTIME in the last 168 hours. Cardiac Enzymes: No results for input(s): CKTOTAL, CKMB, CKMBINDEX, TROPONINI in the last 168 hours. BNP (last 3 results) No results for input(s): PROBNP in the last 8760 hours. HbA1C: No results for input(s): HGBA1C in the last 72 hours. CBG: No results for input(s): GLUCAP in the last 168 hours. Lipid Profile: No results for input(s): CHOL, HDL, LDLCALC, TRIG, CHOLHDL, LDLDIRECT in the last 72 hours. Thyroid Function Tests: No results for input(s): TSH, T4TOTAL, FREET4, T3FREE, THYROIDAB in the last 72 hours. Anemia Panel: No results for input(s): VITAMINB12, FOLATE, FERRITIN, TIBC, IRON, RETICCTPCT in the last 72 hours. Urine analysis:    Component Value Date/Time   COLORURINE YELLOW 05/10/2016 1935   APPEARANCEUR CLOUDY (A) 05/10/2016 1935   LABSPEC 1.016 05/10/2016 1935   PHURINE 5.0 05/10/2016 1935   GLUCOSEU NEGATIVE 05/10/2016 1935   HGBUR SMALL (A) 05/10/2016 1935   BILIRUBINUR NEGATIVE 05/10/2016 1935   KETONESUR NEGATIVE 05/10/2016 1935   PROTEINUR NEGATIVE 05/10/2016 1935   NITRITE NEGATIVE 05/10/2016 1935   LEUKOCYTESUR SMALL (A) 05/10/2016 1935   Sepsis Labs: @LABRCNTIP (procalcitonin:4,lacticidven:4) )No results found for this or any previous visit (from the past 240 hour(s)).   Radiological Exams on Admission: Ct Abdomen Pelvis W Contrast  Result Date: 05/10/2016 CLINICAL DATA:  Left-sided abdominal pain since Sunday now worse today. History of ulcerative colitis and prior colectomy. EXAM: CT ABDOMEN AND PELVIS WITH CONTRAST TECHNIQUE: Multidetector CT imaging of the abdomen and pelvis was performed using the standard protocol following bolus administration of intravenous contrast. CONTRAST:  22m ISOVUE-300 IOPAMIDOL (ISOVUE-300) INJECTION 61% COMPARISON:  None. FINDINGS: Lower chest:  Limited visualization of the lower thorax demonstrates minimal dependent subpleural ground-glass atelectasis, right greater than left. No focal airspace opacities. No pleural effusion. Normal heart size. Coronary artery calcifications. No pericardial effusion. Hepatobiliary: Normal hepatic contour. No discrete hepatic lesions. Normal appearance of the gallbladder given degree distention. No radiopaque gallstones. No intra extrahepatic biliary duct dilatation. No ascites. Pancreas: Normal appearance of the pancreas Spleen: Normal appearance of the spleen. Note is made of a small splenule. Adrenals/Urinary Tract: There is symmetric enhancement of the bilateral kidneys. Note is made of a punctate (approximately 0.5 x 0.7 cm stone within the distal aspect of the right ureter (axial image 68, series 2, coronal image 45, series 5) which results in moderate upstream ureterectasis and pelvicaliectasis. Note is made of an additional punctate (approximately 0.5 cm) nonobstructing stone within an upper/midpole right renal calyx (axial image 32, series 2). No evidence of left-sided nephrolithiasis  urinary obstruction. Note is made of a punctate (approximately 1 cm) hypo attenuating nonenhancing left-sided renal cyst. No discrete right-sided renal lesions. Normal appearance of the bilateral adrenal glands. Normal appearance of the urinary bladder given degree distention. Stomach/Bowel: Enteric contrast extensive the level of the rectum. Sequela of subtotal colectomy with patency of the ileal colonic anastomosis. An additional enteric anastomosis is seen within the left upper abdominal quadrant (axial image 46, series 2, coronal images 23 through 32, series 5). No evidence of enteric obstruction. No pneumoperitoneum, pneumatosis or portal venous gas. No discrete areas of bowel wall thickening. Vascular/Lymphatic: Moderate to large amount of mixed calcified and noncalcified atherosclerotic plaque within a normal caliber abdominal  aorta. The major branch vessels of the abdominal aorta appear patent on this non CTA examination. There is apparent masslike hypertrophy of the fat within the left-side of the abdominal mesentery within the left lower abdominal quadrant which is ill-defined though measures approximately 13.0 x 6.3 x 12 cm (as measured in greatest oblique axial image 46, series 2 and coronal - image 22, series 5) and appears to results in mass effect upon the adjacent bowel. There are no definitive thickened internal septations within this abdominal mesenteries nor is there a definitive swirling of the mesenteric vessels. No bulky retroperitoneal mesenteric, pelvic or inguinal lymphadenopathy. Reproductive: Normal appearance of the pelvic organs. No discrete adnexal lesion. No free fluid the pelvic cul-de-sac. Other: Tiny mesenteric fat containing periumbilical hernia. Musculoskeletal: No acute or aggressive osseous abnormalities. IMPRESSION: 1. Punctate (approximately 7 mm) stone within the distal aspect the right ureter results in moderate upstream ureterectasis and pelvicaliectasis. 2. Solitary additional punctate (approximately 5 mm) nonobstructing right-sided renal stone. No evidence of left-sided nephrolithiasis urinary obstruction. 3. Sequela of subtotal colectomy with additional enteric anastomosis within the right upper abdominal quadrant. No evidence of enteric obstruction at either of these locations. 4. Masslike hypertrophy of the fat within the left-side of the abdominal mesentery which results in mass effect upon the adjacent bowel though again does not result in enteric obstruction. Differential considerations are broad and include an intraperitoneal mesenteric fat containing hernia though a discrete fat containing lesion (lipoma/liposarcoma) cannot be entirely excluded on the basis of this examination. Comparison with prior outside examinations is recommended. Critical Value/emergent results were called by telephone  at the time of interpretation on 05/10/2016 at 9:26 pm to Dr. Tanna Furry , who verbally acknowledged these results. Electronically Signed   By: Sandi Mariscal M.D.   On: 05/10/2016 21:29    Assessment/Plan Principal Problem:   Abdominal pain Active Problems:   Ureteral stone   Essential hypertension   HLD (hyperlipidemia)   History of ulcerative colitis    1. Abdominal pain with CT scan showing mass in the left side involving the mesentery - on-call general surgeon Dr. Redmond Pulling has been consulted. For now I have placed patient nothing by mouth and on IV fluids and pain medications. Further recommendations per general surgery. 2. Right ureteric stone - consult urologist in a.m. 3. Hypertension - since patient is nothing by mouth I have placed patient on when necessary IV hydralazine. 4. Hyperlipidemia - statins once patient can take orally.   DVT prophylaxis: SCDs. Code Status: Full code.  Family Communication: Patient's sister at the bedside.  Disposition Plan: Home.  Consults called: General surgery.  Admission status: Inpatient. MedSurg.    Rise Patience MD [facility] Pager [phone].  If 7PM-7AM, please contact night-coverage [email or web address] Password [facility]  05/11/2016, 2:41 AM

## 2016-05-11 NOTE — Progress Notes (Signed)
PHARMACY NOTE -  ANTIBIOTIC RENAL DOSE ADJUSTMENT   Request received for Pharmacy to assist with antibiotic renal dose adjustment.  Patient has been initiated on Augmentin 875mg  PO bid for cellulitis. SCr 1.12, estimated CrCl 46 ml/min Current dosage is appropriate and need for further dosage adjustment appears unlikely at present. Will sign off at this time.  Please reconsult if a change in clinical status warrants re-evaluation of dosage.  Peggyann Juba, PharmD, BCPS Pager: [phone] 05/11/2016 3:58 PM

## 2016-05-11 NOTE — ED Notes (Signed)
Pt transported with [facility] at this time.

## 2016-05-11 NOTE — Consult Note (Signed)
Reason for Consult: Abdominal pain  Referring Physician: Dr. Freddie Apley is an 60 y.o. female with a history of ulcerative colitis, s/p colectomy/ileostomy & reversal (2004), hyperlipidemia, hypertension and anxiety. Patient started having RUQ pain on Sunday 05/08/2016. Pain then transitioned to LUQ. Described as 10/10, stabbing and constant. Abdominal pain associated with nausea and vomiting and recent fatigue. Patient denies constipation, diarrhea, changes in stool caliber, hematochezia, recent illness or travel. Last colonoscopy was in 2005. Abdominal pain not well controlled with dilaudid. CT findings in ED indicated hypertrophy of fat in left sided abdominal mesentery, creating a mass effect on adjacent bowel without enteric obstruction. WBC 12.2 trending down from 13.4. TB 1.5  Past Medical History:  Diagnosis Date  . Carpal tunnel syndrome   . Depression   . Hyperlipemia   . Hypertension   . Kidney disease, chronic, stage II (mild, EGFR 60+ ml/min)   . Osteopenia   . Ulcerative colitis [facility])     Past Surgical History:  Procedure Laterality Date  . ABDOMINOPERINEAL PROCTOCOLECTOMY    . COLECTOMY      Family History  Problem Relation Age of Onset  . Rectal cancer Father   . Ulcerative colitis Father   . Cervical cancer Maternal Grandmother     Social History:  reports that she has never smoked. She has never used smokeless tobacco. She reports that she does not drink alcohol or use drugs.  Allergies: No Known Allergies  Prior to Admission medications   Medication Sig Start Date End Date Taking? Authorizing Provider  atorvastatin (LIPITOR) 10 MG tablet Take 10 mg by mouth daily.   Yes Historical Provider, MD  calcium carbonate (OS-CAL - DOSED IN MG OF ELEMENTAL CALCIUM) 1250 (500 Ca) MG tablet Take 1 tablet by mouth daily with breakfast.   Yes Historical Provider, MD  Multiple Vitamin (MULTIVITAMIN) capsule Take 1 capsule by mouth daily.   Yes Historical Provider,  MD  sertraline (ZOLOFT) 100 MG tablet Take 150 mg by mouth daily.   Yes Historical Provider, MD  valsartan (DIOVAN) 160 MG tablet Take 160 mg by mouth daily.   Yes Historical Provider, MD     Results for orders placed or performed during the hospital encounter of 05/10/16 (from the past 48 hour(s))  CBC with Differential/Platelet     Status: Abnormal   Collection Time: 05/10/16  6:47 PM  Result Value Ref Range   WBC 13.4 (H) 4.0 - 10.5 K/uL   RBC 4.25 3.87 - 5.11 MIL/uL   Hemoglobin 13.6 12.0 - 15.0 g/dL   HCT 40.4 36.0 - 46.0 %   MCV 95.1 78.0 - 100.0 fL   MCH 32.0 26.0 - 34.0 pg   MCHC 33.7 30.0 - 36.0 g/dL   RDW 13.4 11.5 - 15.5 %   Platelets 224 150 - 400 K/uL   Neutrophils Relative % 85 %   Neutro Abs 11.3 (H) 1.7 - 7.7 K/uL   Lymphocytes Relative 6 %   Lymphs Abs 0.9 0.7 - 4.0 K/uL   Monocytes Relative 9 %   Monocytes Absolute 1.2 (H) 0.1 - 1.0 K/uL   Eosinophils Relative 0 %   Eosinophils Absolute 0.0 0.0 - 0.7 K/uL   Basophils Relative 0 %   Basophils Absolute 0.0 0.0 - 0.1 K/uL  Comprehensive metabolic panel     Status: Abnormal   Collection Time: 05/10/16  6:47 PM  Result Value Ref Range   Sodium 137 135 - 145 mmol/L   Potassium 4.1  3.5 - 5.1 mmol/L   Chloride 102 101 - 111 mmol/L   CO2 25 22 - 32 mmol/L   Glucose, Bld 121 (H) 65 - 99 mg/dL   BUN 17 6 - 20 mg/dL   Creatinine, Ser 1.37 (H) 0.44 - 1.00 mg/dL   Calcium 9.5 8.9 - 10.3 mg/dL   Total Protein 8.4 (H) 6.5 - 8.1 g/dL   Albumin 4.2 3.5 - 5.0 g/dL   AST 25 15 - 41 U/L   ALT 18 14 - 54 U/L   Alkaline Phosphatase 97 38 - 126 U/L   Total Bilirubin 1.4 (H) 0.3 - 1.2 mg/dL   GFR calc non Af Amer 41 (L) >60 mL/min   GFR calc Af Amer 48 (L) >60 mL/min    Comment: (NOTE) The eGFR has been calculated using the CKD EPI equation. This calculation has not been validated in all clinical situations. eGFR's persistently <60 mL/min signify possible Chronic Kidney Disease.    Anion gap 10 5 - 15  Lipase, blood      Status: None   Collection Time: 05/10/16  6:47 PM  Result Value Ref Range   Lipase 22 11 - 51 U/L  Urinalysis, Routine w reflex microscopic (not at [facility])     Status: Abnormal   Collection Time: 05/10/16  7:35 PM  Result Value Ref Range   Color, Urine YELLOW YELLOW   APPearance CLOUDY (A) CLEAR   Specific Gravity, Urine 1.016 1.005 - 1.030   pH 5.0 5.0 - 8.0   Glucose, UA NEGATIVE NEGATIVE mg/dL   Hgb urine dipstick SMALL (A) NEGATIVE   Bilirubin Urine NEGATIVE NEGATIVE   Ketones, ur NEGATIVE NEGATIVE mg/dL   Protein, ur NEGATIVE NEGATIVE mg/dL   Nitrite NEGATIVE NEGATIVE   Leukocytes, UA SMALL (A) NEGATIVE  Urine microscopic-add on     Status: Abnormal   Collection Time: 05/10/16  7:35 PM  Result Value Ref Range   Squamous Epithelial / LPF 0-5 (A) NONE SEEN   WBC, UA 0-5 0 - 5 WBC/hpf   RBC / HPF 0-5 0 - 5 RBC/hpf   Bacteria, UA RARE (A) NONE SEEN   Urine-Other MUCOUS PRESENT   I-Stat CG4 Lactic Acid, ED     Status: Abnormal   Collection Time: 05/10/16  9:52 PM  Result Value Ref Range   Lactic Acid, Venous 0.49 (L) 0.5 - 1.9 mmol/L  Type and screen [place]     Status: None   Collection Time: 05/11/16  3:11 AM  Result Value Ref Range   ABO/RH(D) AB POS    Antibody Screen NEG    Sample Expiration 05/14/2016   Comprehensive metabolic panel     Status: Abnormal   Collection Time: 05/11/16  3:11 AM  Result Value Ref Range   Sodium 138 135 - 145 mmol/L   Potassium 3.9 3.5 - 5.1 mmol/L   Chloride 106 101 - 111 mmol/L   CO2 26 22 - 32 mmol/L   Glucose, Bld 131 (H) 65 - 99 mg/dL   BUN 15 6 - 20 mg/dL   Creatinine, Ser 1.12 (H) 0.44 - 1.00 mg/dL   Calcium 8.3 (L) 8.9 - 10.3 mg/dL   Total Protein 7.1 6.5 - 8.1 g/dL   Albumin 3.6 3.5 - 5.0 g/dL   AST 20 15 - 41 U/L   ALT 16 14 - 54 U/L   Alkaline Phosphatase 78 38 - 126 U/L   Total Bilirubin 1.5 (H) 0.3 - 1.2 mg/dL  GFR calc non Af Amer 53 (L) >60 mL/min   GFR calc Af Amer >60 >60 mL/min    Comment:  (NOTE) The eGFR has been calculated using the CKD EPI equation. This calculation has not been validated in all clinical situations. eGFR's persistently <60 mL/min signify possible Chronic Kidney Disease.    Anion gap 6 5 - 15  CBC WITH DIFFERENTIAL     Status: Abnormal   Collection Time: 05/11/16  3:11 AM  Result Value Ref Range   WBC 12.2 (H) 4.0 - 10.5 K/uL   RBC 3.65 (L) 3.87 - 5.11 MIL/uL   Hemoglobin 11.7 (L) 12.0 - 15.0 g/dL   HCT 34.9 (L) 36.0 - 46.0 %   MCV 95.6 78.0 - 100.0 fL   MCH 32.1 26.0 - 34.0 pg   MCHC 33.5 30.0 - 36.0 g/dL   RDW 13.7 11.5 - 15.5 %   Platelets 193 150 - 400 K/uL   Neutrophils Relative % 84 %   Neutro Abs 10.3 (H) 1.7 - 7.7 K/uL   Lymphocytes Relative 6 %   Lymphs Abs 0.7 0.7 - 4.0 K/uL   Monocytes Relative 10 %   Monocytes Absolute 1.2 (H) 0.1 - 1.0 K/uL   Eosinophils Relative 0 %   Eosinophils Absolute 0.0 0.0 - 0.7 K/uL   Basophils Relative 0 %   Basophils Absolute 0.0 0.0 - 0.1 K/uL  ABO/Rh     Status: None   Collection Time: 05/11/16  3:11 AM  Result Value Ref Range   ABO/RH(D) AB POS   Glucose, capillary     Status: Abnormal   Collection Time: 05/11/16  7:43 AM  Result Value Ref Range   Glucose-Capillary 113 (H) 65 - 99 mg/dL    Ct Abdomen Pelvis W Contrast  Result Date: 05/10/2016 CLINICAL DATA:  Left-sided abdominal pain since Sunday now worse today. History of ulcerative colitis and prior colectomy. EXAM: CT ABDOMEN AND PELVIS WITH CONTRAST TECHNIQUE: Multidetector CT imaging of the abdomen and pelvis was performed using the standard protocol following bolus administration of intravenous contrast. CONTRAST:  81m ISOVUE-300 IOPAMIDOL (ISOVUE-300) INJECTION 61% COMPARISON:  None. FINDINGS: Lower chest: Limited visualization of the lower thorax demonstrates minimal dependent subpleural ground-glass atelectasis, right greater than left. No focal airspace opacities. No pleural effusion. Normal heart size. Coronary artery calcifications. No  pericardial effusion. Hepatobiliary: Normal hepatic contour. No discrete hepatic lesions. Normal appearance of the gallbladder given degree distention. No radiopaque gallstones. No intra extrahepatic biliary duct dilatation. No ascites. Pancreas: Normal appearance of the pancreas Spleen: Normal appearance of the spleen. Note is made of a small splenule. Adrenals/Urinary Tract: There is symmetric enhancement of the bilateral kidneys. Note is made of a punctate (approximately 0.5 x 0.7 cm stone within the distal aspect of the right ureter (axial image 68, series 2, coronal image 45, series 5) which results in moderate upstream ureterectasis and pelvicaliectasis. Note is made of an additional punctate (approximately 0.5 cm) nonobstructing stone within an upper/midpole right renal calyx (axial image 32, series 2). No evidence of left-sided nephrolithiasis urinary obstruction. Note is made of a punctate (approximately 1 cm) hypo attenuating nonenhancing left-sided renal cyst. No discrete right-sided renal lesions. Normal appearance of the bilateral adrenal glands. Normal appearance of the urinary bladder given degree distention. Stomach/Bowel: Enteric contrast extensive the level of the rectum. Sequela of subtotal colectomy with patency of the ileal colonic anastomosis. An additional enteric anastomosis is seen within the left upper abdominal quadrant (axial image 46, series 2, coronal  images 23 through 32, series 5). No evidence of enteric obstruction. No pneumoperitoneum, pneumatosis or portal venous gas. No discrete areas of bowel wall thickening. Vascular/Lymphatic: Moderate to large amount of mixed calcified and noncalcified atherosclerotic plaque within a normal caliber abdominal aorta. The major branch vessels of the abdominal aorta appear patent on this non CTA examination. There is apparent masslike hypertrophy of the fat within the left-side of the abdominal mesentery within the left lower abdominal quadrant  which is ill-defined though measures approximately 13.0 x 6.3 x 12 cm (as measured in greatest oblique axial image 46, series 2 and coronal - image 22, series 5) and appears to results in mass effect upon the adjacent bowel. There are no definitive thickened internal septations within this abdominal mesenteries nor is there a definitive swirling of the mesenteric vessels. No bulky retroperitoneal mesenteric, pelvic or inguinal lymphadenopathy. Reproductive: Normal appearance of the pelvic organs. No discrete adnexal lesion. No free fluid the pelvic cul-de-sac. Other: Tiny mesenteric fat containing periumbilical hernia. Musculoskeletal: No acute or aggressive osseous abnormalities. IMPRESSION: 1. Punctate (approximately 7 mm) stone within the distal aspect the right ureter results in moderate upstream ureterectasis and pelvicaliectasis. 2. Solitary additional punctate (approximately 5 mm) nonobstructing right-sided renal stone. No evidence of left-sided nephrolithiasis urinary obstruction. 3. Sequela of subtotal colectomy with additional enteric anastomosis within the right upper abdominal quadrant. No evidence of enteric obstruction at either of these locations. 4. Masslike hypertrophy of the fat within the left-side of the abdominal mesentery which results in mass effect upon the adjacent bowel though again does not result in enteric obstruction. Differential considerations are broad and include an intraperitoneal mesenteric fat containing hernia though a discrete fat containing lesion (lipoma/liposarcoma) cannot be entirely excluded on the basis of this examination. Comparison with prior outside examinations is recommended. Critical Value/emergent results were called by telephone at the time of interpretation on 05/10/2016 at 9:26 pm to Dr. Tanna Furry , who verbally acknowledged these results. Electronically Signed   By: Sandi Mariscal M.D.   On: 05/10/2016 21:29   ROS Blood pressure (!) 127/59, pulse 88,  temperature 98.3 F (36.8 C), temperature source Oral, resp. rate 18, height 4' 11"  (1.499 m), weight 155 lb (70.3 kg), SpO2 96 %. Physical Exam  Vitals reviewed. Constitutional: She is oriented to person, place, and time. She appears well-developed and well-nourished.  HENT:  Head: Normocephalic and atraumatic.  Eyes: Conjunctivae are normal. Pupils are equal, round, and reactive to light.  Neck: Normal range of motion.  Cardiovascular: Normal rate, normal heart sounds and intact distal pulses.  Exam reveals no gallop and no friction rub.   No murmur heard. Respiratory: Effort normal and breath sounds normal. No respiratory distress. She has no wheezes. She has no rales. She exhibits no tenderness.  GI: Soft.  Patient's abdomen is tender to light palpation in the epigastric region and LUQ. Distended. Guarding present. No rigidity. Palpable, smooth, moveable 2cmx2cm mass in LUQ. +BS   Neurological: She is alert and oriented to person, place, and time. No cranial nerve deficit.  Skin: Skin is warm and dry. No erythema.  Psychiatric: She has a normal mood and affect. Her behavior is normal. Judgment and thought content normal.    Assessment/Plan: LUQ abdominal pain  History of ulcerative colitis, s/p colectomy/ileostomy & reversal (2004) WBC 12.2. Significant abdominal pain with guarding.  FEN: NPO after midnight  VTE: SCDs Dispo: Pain management and observation over night. Further surgical recommendations per MD.   Lannie Fields  PASII  05/11/2016, 2:53 PM

## 2016-05-11 NOTE — Progress Notes (Signed)
[facility]                                                                                                                                                                         Patient Demographics  Leslie Harrison, is a 60 y.o. female  [id number]  [id number]  DOB - 1956-03-06  Admit date - 05/10/2016  Admitting Physician Rise Patience, MD  Outpatient Primary MD for the patient is Carlos Levering, PA-C  LOS - 0   Chief Complaint  Patient presents with  . Abdominal Pain        Assessment & Plan    Patient seen Few hours after admission on 05/11/2016, I had reviewed her chart earlier and called urology and general surgery consult already, while I went to examine the patient the urology resident was present in the room.  Patient and her family member told me that a few hours ago a medical doctor had seen the patient and he was entire to use less, she said she does not understand why medical doctors are seeing the patient as they have nothing to offer medically, she said she thinks it's a complete waste of time. She wanted to see surgeon and urologist.  At this point it told the patient and family member that both surgery and urology have been consulted, urology resident was already in the room. Since they did not want to be seen by medicine team I excused myself.  I informed this both to urologist Dr. Vikki Ports who is seen the patient and advised that she can be discharged from their standpoint on Flomax with outpatient urology follow-up, I then called general surgeon Dr. Harlow Asa who said that patient likely does not have any surgical issues either and that he would see her shortly, he did not think he felt comfortable taking the patient on his service as she did not have any surgical issues and he thought patient could be discharged home today after he sees the  patient.    Medications  Scheduled Meds:  Continuous Infusions: . dextrose 5 % and 0.9% NaCl 1,000 mL (05/11/16 0328)   PRN Meds:.acetaminophen **OR** acetaminophen, hydrALAZINE, HYDROmorphone (DILAUDID) injection, ondansetron **OR** ondansetron (ZOFRAN) IV    Time Spent in minutes   10 minutes   Lala Lund K M.D on 05/11/2016 at 10:49 AM  Between 7am to 7pm - Pager - [phone]  After 7pm go to [email or web address] - password [email or web address]  And look for the night coverage person covering for me after hours  [facility] Office  [phone]    Subjective:   Paris Lal today in bed, comfortable.  Objective:   Vitals:   05/10/16 2316 05/10/16 2351 05/11/16 0110 05/11/16 0635  BP: 104/70 118/61 110/61  122/85  Pulse: 92 94 83 87  Resp: 16 18 17 18   Temp:  98.7 F (37.1 C) 98.2 F (36.8 C) 98.5 F (36.9 C)  TempSrc:  Oral Oral Oral  SpO2: 90% 94% 92% 96%  Weight:      Height:        Wt Readings from Last 3 Encounters:  05/10/16 70.3 kg (155 lb)     Intake/Output Summary (Last 24 hours) at 05/11/16 1049 Last data filed at 05/11/16 0600  Gross per 24 hour  Intake              190 ml  Output              400 ml  Net             -210 ml    Exam  Deferred  Data Reviewed

## 2016-05-12 ENCOUNTER — Encounter (HOSPITAL_COMMUNITY): Admission: EM | Disposition: A | Discharge status: Home / Self Care | Payer: Self-pay | Source: Home / Self Care

## 2016-05-12 ENCOUNTER — Encounter (HOSPITAL_COMMUNITY): Payer: Self-pay | Admitting: Certified Registered Nurse Anesthetist

## 2016-05-12 ENCOUNTER — Observation Stay (HOSPITAL_COMMUNITY): Payer: 59 | Admitting: Anesthesiology

## 2016-05-12 HISTORY — PX: LAPAROTOMY: SHX154

## 2016-05-12 LAB — CREATININE, SERUM
CREATININE: 1.04 mg/dL — AB (ref 0.44–1.00)
GFR calc Af Amer: >60 mL/min (ref >60)
GFR, EST NON AFRICAN AMERICAN: 58 mL/min — AB (ref >60)

## 2016-05-12 LAB — CBC
HEMATOCRIT: 34.5 % — AB (ref 36.0–46.0)
HEMOGLOBIN: 11.4 g/dL — AB (ref 12.0–15.0)
MCH: 31.4 pg (ref 26.0–34.0)
MCHC: 33 g/dL (ref 30.0–36.0)
MCV: 95 fL (ref 78.0–100.0)
Platelets: 195 10*3/uL (ref 150–400)
RBC: 3.63 MIL/uL — AB (ref 3.87–5.11)
RDW: 13.6 % (ref 11.5–15.5)
WBC: 12.2 10*3/uL — AB (ref 4.0–10.5)

## 2016-05-12 LAB — SURGICAL PCR SCREEN
MRSA, PCR: NEGATIVE
Staphylococcus aureus: NEGATIVE

## 2016-05-12 LAB — GLUCOSE, CAPILLARY: GLUCOSE-CAPILLARY: 116 mg/dL — AB (ref 65–99)

## 2016-05-12 SURGERY — LAPAROTOMY, EXPLORATORY
Anesthesia: General | Site: Abdomen

## 2016-05-12 MED ORDER — SODIUM CHLORIDE 0.9 % IV SOLN
1.0000 g | INTRAVENOUS | Status: AC
  Administered 2016-05-12: 1 g via INTRAVENOUS
  Filled 2016-05-12: qty 1

## 2016-05-12 MED ORDER — PROPOFOL 10 MG/ML IV BOLUS
INTRAVENOUS | Status: DC | PRN
  Administered 2016-05-12: 140 mg via INTRAVENOUS

## 2016-05-12 MED ORDER — ONDANSETRON HCL 4 MG/2ML IJ SOLN: 4.0000 mg | Freq: Four times a day (QID) | INTRAMUSCULAR | Status: DC | PRN

## 2016-05-12 MED ORDER — DIPHENHYDRAMINE HCL 50 MG/ML IJ SOLN: 12.5000 mg | Freq: Four times a day (QID) | INTRAMUSCULAR | Status: DC | PRN

## 2016-05-12 MED ORDER — FENTANYL CITRATE (PF) 100 MCG/2ML IJ SOLN
INTRAMUSCULAR | Status: DC | PRN
  Administered 2016-05-12: 100 ug via INTRAVENOUS
  Administered 2016-05-12 (×3): 50 ug via INTRAVENOUS

## 2016-05-12 MED ORDER — ONDANSETRON 4 MG PO TBDP: 4.0000 mg | ORAL_TABLET | Freq: Four times a day (QID) | ORAL | Status: DC | PRN

## 2016-05-12 MED ORDER — HYDROMORPHONE HCL 1 MG/ML IJ SOLN
0.2500 mg | INTRAMUSCULAR | Status: DC | PRN
  Administered 2016-05-12: 0.25 mg via INTRAVENOUS
  Administered 2016-05-12 (×2): 0.5 mg via INTRAVENOUS

## 2016-05-12 MED ORDER — SUGAMMADEX SODIUM 200 MG/2ML IV SOLN
INTRAVENOUS | Status: DC | PRN
  Administered 2016-05-12: 150 mg via INTRAVENOUS

## 2016-05-12 MED ORDER — FENTANYL CITRATE (PF) 250 MCG/5ML IJ SOLN
INTRAMUSCULAR | Status: AC
  Filled 2016-05-12: qty 5

## 2016-05-12 MED ORDER — SODIUM CHLORIDE 0.9% FLUSH: 9.0000 mL | INTRAVENOUS | Status: DC | PRN

## 2016-05-12 MED ORDER — HYDROMORPHONE HCL 1 MG/ML IJ SOLN
INTRAMUSCULAR | Status: AC
  Filled 2016-05-12: qty 1

## 2016-05-12 MED ORDER — ENOXAPARIN SODIUM 40 MG/0.4ML ~~LOC~~ SOLN
40.0000 mg | SUBCUTANEOUS | Status: DC
  Administered 2016-05-13 – 2016-05-18 (×6): 40 mg via SUBCUTANEOUS
  Filled 2016-05-12 (×6): qty 0.4

## 2016-05-12 MED ORDER — NALOXONE HCL 0.4 MG/ML IJ SOLN: 0.4000 mg | INTRAMUSCULAR | Status: DC | PRN

## 2016-05-12 MED ORDER — SCOPOLAMINE 1 MG/3DAYS TD PT72
1.0000 | MEDICATED_PATCH | TRANSDERMAL | Status: DC
  Administered 2016-05-12: 1.5 mg via TRANSDERMAL

## 2016-05-12 MED ORDER — SUCCINYLCHOLINE CHLORIDE 20 MG/ML IJ SOLN
INTRAMUSCULAR | Status: DC | PRN
  Administered 2016-05-12: 60 mg via INTRAVENOUS

## 2016-05-12 MED ORDER — DIPHENHYDRAMINE HCL 12.5 MG/5ML PO ELIX: 12.5000 mg | ORAL_SOLUTION | Freq: Four times a day (QID) | ORAL | Status: DC | PRN

## 2016-05-12 MED ORDER — LIDOCAINE HCL (CARDIAC) 20 MG/ML IV SOLN
INTRAVENOUS | Status: AC
  Filled 2016-05-12: qty 5

## 2016-05-12 MED ORDER — KCL IN DEXTROSE-NACL 20-5-0.45 MEQ/L-%-% IV SOLN
INTRAVENOUS | Status: DC
Start: 2016-05-12 — End: 2016-05-18
  Administered 2016-05-13: 15:00:00 via INTRAVENOUS
  Administered 2016-05-13: 1000 mL via INTRAVENOUS
  Administered 2016-05-14 – 2016-05-18 (×7): via INTRAVENOUS
  Filled 2016-05-12 (×15): qty 1000

## 2016-05-12 MED ORDER — SODIUM CHLORIDE 0.9 % IR SOLN
Status: DC | PRN
  Administered 2016-05-12: 2000 mL

## 2016-05-12 MED ORDER — CHLORHEXIDINE GLUCONATE CLOTH 2 % EX PADS: 6.0000 | MEDICATED_PAD | Freq: Once | CUTANEOUS | Status: DC

## 2016-05-12 MED ORDER — LACTATED RINGERS IV SOLN
INTRAVENOUS | Status: DC
  Administered 2016-05-12 (×2): via INTRAVENOUS

## 2016-05-12 MED ORDER — HYDROMORPHONE HCL 2 MG/ML IJ SOLN
INTRAMUSCULAR | Status: AC
  Filled 2016-05-12: qty 1

## 2016-05-12 MED ORDER — MIDAZOLAM HCL 5 MG/5ML IJ SOLN
INTRAMUSCULAR | Status: DC | PRN
  Administered 2016-05-12: 2 mg via INTRAVENOUS

## 2016-05-12 MED ORDER — SCOPOLAMINE 1 MG/3DAYS TD PT72
MEDICATED_PATCH | TRANSDERMAL | Status: AC
  Filled 2016-05-12: qty 1

## 2016-05-12 MED ORDER — ONDANSETRON HCL 4 MG/2ML IJ SOLN
INTRAMUSCULAR | Status: DC | PRN
  Administered 2016-05-12: 4 mg via INTRAVENOUS

## 2016-05-12 MED ORDER — HYDROMORPHONE HCL 1 MG/ML IJ SOLN
INTRAMUSCULAR | Status: DC | PRN
  Administered 2016-05-12 (×2): 0.5 mg via INTRAVENOUS

## 2016-05-12 MED ORDER — PHENYLEPHRINE HCL 10 MG/ML IJ SOLN
INTRAMUSCULAR | Status: DC | PRN
  Administered 2016-05-12 (×2): 80 ug via INTRAVENOUS
  Administered 2016-05-12: 120 ug via INTRAVENOUS

## 2016-05-12 MED ORDER — ROCURONIUM BROMIDE 100 MG/10ML IV SOLN
INTRAVENOUS | Status: DC | PRN
  Administered 2016-05-12: 40 mg via INTRAVENOUS

## 2016-05-12 MED ORDER — LIDOCAINE HCL (CARDIAC) 20 MG/ML IV SOLN
INTRAVENOUS | Status: DC | PRN
  Administered 2016-05-12: 50 mg via INTRAVENOUS

## 2016-05-12 MED ORDER — ONDANSETRON HCL 4 MG/2ML IJ SOLN
INTRAMUSCULAR | Status: AC
  Filled 2016-05-12: qty 2

## 2016-05-12 MED ORDER — MIDAZOLAM HCL 2 MG/2ML IJ SOLN
INTRAMUSCULAR | Status: AC
  Filled 2016-05-12: qty 2

## 2016-05-12 MED ORDER — PROPOFOL 10 MG/ML IV BOLUS
INTRAVENOUS | Status: AC
  Filled 2016-05-12: qty 20

## 2016-05-12 MED ORDER — MORPHINE SULFATE 2 MG/ML IV SOLN
INTRAVENOUS | Status: DC
  Administered 2016-05-12: 17:00:00 via INTRAVENOUS
  Administered 2016-05-12: 4.3 mg via INTRAVENOUS
  Administered 2016-05-13: 12 mg via INTRAVENOUS
  Administered 2016-05-13 (×2): 7.5 mg via INTRAVENOUS
  Administered 2016-05-13: 19:00:00 via INTRAVENOUS
  Administered 2016-05-13: 6 mg via INTRAVENOUS
  Administered 2016-05-13: 4.5 mg via INTRAVENOUS
  Administered 2016-05-14: 13.5 mg via INTRAVENOUS
  Administered 2016-05-14: 3 mg via INTRAVENOUS
  Administered 2016-05-14: 6 mg via INTRAVENOUS
  Administered 2016-05-14: 9 mg via INTRAVENOUS
  Administered 2016-05-14: 4.5 mg via INTRAVENOUS
  Administered 2016-05-14: 10.25 mg via INTRAVENOUS
  Filled 2016-05-12 (×2): qty 25

## 2016-05-12 SURGICAL SUPPLY — 39 items
APPLICATOR COTTON TIP 6IN STRL (MISCELLANEOUS) IMPLANT
BLADE EXTENDED COATED 6.5IN (ELECTRODE) ×2 IMPLANT
BLADE HEX COATED 2.75 (ELECTRODE) ×2 IMPLANT
COVER MAYO STAND STRL (DRAPES) IMPLANT
COVER SURGICAL LIGHT HANDLE (MISCELLANEOUS) ×2 IMPLANT
DRAPE LAPAROSCOPIC ABDOMINAL (DRAPES) ×2 IMPLANT
DRAPE WARM FLUID 44X44 (DRAPE) ×2 IMPLANT
DRSG OPSITE POSTOP 4X10 (GAUZE/BANDAGES/DRESSINGS) ×2 IMPLANT
DRSG TEGADERM 6X8 (GAUZE/BANDAGES/DRESSINGS) ×4 IMPLANT
ELECT REM PT RETURN 9FT ADLT (ELECTROSURGICAL) ×2
ELECTRODE REM PT RTRN 9FT ADLT (ELECTROSURGICAL) ×1 IMPLANT
GAUZE SPONGE 4X4 12PLY STRL (GAUZE/BANDAGES/DRESSINGS) ×2 IMPLANT
GLOVE BIOGEL PI IND STRL 7.0 (GLOVE) ×1 IMPLANT
GLOVE BIOGEL PI INDICATOR 7.0 (GLOVE) ×1
GLOVE SURG ORTHO 8.0 STRL STRW (GLOVE) ×2 IMPLANT
GOWN STRL REUS W/TWL LRG LVL3 (GOWN DISPOSABLE) ×2 IMPLANT
GOWN STRL REUS W/TWL XL LVL3 (GOWN DISPOSABLE) ×4 IMPLANT
HANDLE SUCTION POOLE (INSTRUMENTS) IMPLANT
KIT BASIN OR (CUSTOM PROCEDURE TRAY) ×2 IMPLANT
NS IRRIG 1000ML POUR BTL (IV SOLUTION) ×2 IMPLANT
PACK GENERAL/GYN (CUSTOM PROCEDURE TRAY) ×2 IMPLANT
SPONGE LAP 18X18 X RAY DECT (DISPOSABLE) IMPLANT
STAPLER VISISTAT 35W (STAPLE) ×2 IMPLANT
SUCTION POOLE HANDLE (INSTRUMENTS)
SUT NOV 1 T60/GS (SUTURE) IMPLANT
SUT NOVA NAB DX-16 0-1 5-0 T12 (SUTURE) ×8 IMPLANT
SUT SILK 2 0 (SUTURE) ×1
SUT SILK 2 0 SH CR/8 (SUTURE) ×2 IMPLANT
SUT SILK 2-0 18XBRD TIE 12 (SUTURE) ×1 IMPLANT
SUT SILK 3 0 (SUTURE) ×1
SUT SILK 3 0 SH CR/8 (SUTURE) ×2 IMPLANT
SUT SILK 3-0 18XBRD TIE 12 (SUTURE) ×1 IMPLANT
SUT VICRYL 2 0 18  UND BR (SUTURE)
SUT VICRYL 2 0 18 UND BR (SUTURE) IMPLANT
TAPE CLOTH SURG 4X10 WHT LF (GAUZE/BANDAGES/DRESSINGS) ×2 IMPLANT
TOWEL OR 17X26 10 PK STRL BLUE (TOWEL DISPOSABLE) ×4 IMPLANT
TRAY FOLEY W/METER SILVER 14FR (SET/KITS/TRAYS/PACK) IMPLANT
WATER STERILE IRR 1500ML POUR (IV SOLUTION) ×2 IMPLANT
YANKAUER SUCT BULB TIP NO VENT (SUCTIONS) IMPLANT

## 2016-05-12 NOTE — Progress Notes (Signed)
Patient ID: Leslie Harrison, female   DOB: [birth year], 60 y.o.   MRN: [id number]  [facility]  Subjective: Patient in bed, continued pain.  Family at bedside.  Objective: Vital signs in last 24 hours: Temp:  [98.3 F (36.8 C)-99.4 F (37.4 C)] 99 F (37.2 C) (07/27 0414) Pulse Rate:  [79-88] 79 (07/27 0414) Resp:  [16-18] 16 (07/27 0414) BP: (127-145)/(59-66) 145/66 (07/27 0414) SpO2:  [95 %-97 %] 97 % (07/27 0414) FiO2 (%):  [2 %] 2 % (07/27 0414) Weight:  [70.3 kg (155 lb)] 70.3 kg (155 lb) (07/26 1753) Last BM Date: 05/10/16  Intake/Output from previous day: 07/26 0701 - 07/27 0700 In: -  Out: 500 [Urine:500] Intake/Output this shift: No intake/output data recorded.  Physical Exam: HEENT - sclerae clear, mucous membranes moist Neck - soft Chest - clear bilaterally Cor - RRR Abdomen - mild distension; tender left mid abdomen with guarding Ext - no edema, non-tender Neuro - alert & oriented, no focal deficits  Lab Results:   Recent Labs  05/11/16 0311 05/12/16 0441  WBC 12.2* 12.2*  HGB 11.7* 11.4*  HCT 34.9* 34.5*  PLT 193 195   BMET  Recent Labs  05/10/16 1847 05/11/16 0311  NA 137 138  K 4.1 3.9  CL 102 106  CO2 25 26  GLUCOSE 121* 131*  BUN 17 15  CREATININE 1.37* 1.12*  CALCIUM 9.5 8.3*   PT/INR No results for input(s): LABPROT, INR in the last 72 hours. Comprehensive Metabolic Panel:    Component Value Date/Time   NA 138 05/11/2016 0311   NA 137 05/10/2016 1847   K 3.9 05/11/2016 0311   K 4.1 05/10/2016 1847   CL 106 05/11/2016 0311   CL 102 05/10/2016 1847   CO2 26 05/11/2016 0311   CO2 25 05/10/2016 1847   BUN 15 05/11/2016 0311   BUN 17 05/10/2016 1847   CREATININE 1.12 (H) 05/11/2016 0311   CREATININE 1.37 (H) 05/10/2016 1847   GLUCOSE 131 (H) 05/11/2016 0311   GLUCOSE 121 (H) 05/10/2016 1847   CALCIUM 8.3 (L) 05/11/2016 0311   CALCIUM 9.5 05/10/2016 1847   AST 20 05/11/2016 0311   AST 25  05/10/2016 1847   ALT 16 05/11/2016 0311   ALT 18 05/10/2016 1847   ALKPHOS 78 05/11/2016 0311   ALKPHOS 97 05/10/2016 1847   BILITOT 1.5 (H) 05/11/2016 0311   BILITOT 1.4 (H) 05/10/2016 1847   PROT 7.1 05/11/2016 0311   PROT 8.4 (H) 05/10/2016 1847   ALBUMIN 3.6 05/11/2016 0311   ALBUMIN 4.2 05/10/2016 1847    Studies/Results: Mr Abdomen W Wo Contrast  Result Date: 05/11/2016 CLINICAL DATA:  Fatty lesion and left mid abdomen on CT, abdominal pain EXAM: MRI ABDOMEN WITHOUT AND WITH CONTRAST TECHNIQUE: Multiplanar multisequence MR imaging of the abdomen was performed both before and after the administration of intravenous contrast. CONTRAST:  15 mL Multihance IV COMPARISON:  CT abdomen pelvis dated 05/10/2016 FINDINGS: Motion degraded images. Lower chest: Mild eventration of the right hemidiaphragm. Lung bases are clear. Hepatobiliary: Liver is within normal limits.  No hepatic steatosis. Gallbladder is unremarkable. No intrahepatic or extrahepatic ductal dilatation. Pancreas: Within normal limits. Spleen: Within normal limits. Adrenals/Urinary Tract: Adrenal glands are within normal limits. Scarring along the right upper kidney. 5 mm nonobstructing right upper pole renal calculus (series 6/image 17), better visualized on CT. Moderate right hydronephrosis. 10 mm left upper pole renal cyst (series 6/image 8). No left hydronephrosis.  Stomach/Bowel: Stomach and visualized bowel are unremarkable. Vascular/Lymphatic: No evidence abdominal aortic aneurysm. No suspicious abdominal lymphadenopathy. Other: No abdominal ascites. 6.6 x 13.0 x 13.0 cm fat density lesion in the left mid abdomen (series 6/ image 27). No solid components/soft tissue elements. No enhancement following contrast administration. Musculoskeletal: No focal osseous lesions. IMPRESSION: 6.6 x 13.0 x 13.0 cm fat density lesion in the left mid abdomen. No solid components/soft tissue elements or enhancement on MRI. No overt malignant  features. Differential considerations include lipoma (favored) and well-differentiated liposarcoma (not entirely excluded). While it is not possible to reliably distinguish between these two entities on imaging, the lack of overt malignant features is certainly reassuring. Electronically Signed   By: Julian Hy M.D.   On: 05/11/2016 19:33  Ct Abdomen Pelvis W Contrast  Result Date: 05/10/2016 CLINICAL DATA:  Left-sided abdominal pain since Sunday now worse today. History of ulcerative colitis and prior colectomy. EXAM: CT ABDOMEN AND PELVIS WITH CONTRAST TECHNIQUE: Multidetector CT imaging of the abdomen and pelvis was performed using the standard protocol following bolus administration of intravenous contrast. CONTRAST:  22mL ISOVUE-300 IOPAMIDOL (ISOVUE-300) INJECTION 61% COMPARISON:  None. FINDINGS: Lower chest: Limited visualization of the lower thorax demonstrates minimal dependent subpleural ground-glass atelectasis, right greater than left. No focal airspace opacities. No pleural effusion. Normal heart size. Coronary artery calcifications. No pericardial effusion. Hepatobiliary: Normal hepatic contour. No discrete hepatic lesions. Normal appearance of the gallbladder given degree distention. No radiopaque gallstones. No intra extrahepatic biliary duct dilatation. No ascites. Pancreas: Normal appearance of the pancreas Spleen: Normal appearance of the spleen. Note is made of a small splenule. Adrenals/Urinary Tract: There is symmetric enhancement of the bilateral kidneys. Note is made of a punctate (approximately 0.5 x 0.7 cm stone within the distal aspect of the right ureter (axial image 68, series 2, coronal image 45, series 5) which results in moderate upstream ureterectasis and pelvicaliectasis. Note is made of an additional punctate (approximately 0.5 cm) nonobstructing stone within an upper/midpole right renal calyx (axial image 32, series 2). No evidence of left-sided nephrolithiasis urinary  obstruction. Note is made of a punctate (approximately 1 cm) hypo attenuating nonenhancing left-sided renal cyst. No discrete right-sided renal lesions. Normal appearance of the bilateral adrenal glands. Normal appearance of the urinary bladder given degree distention. Stomach/Bowel: Enteric contrast extensive the level of the rectum. Sequela of subtotal colectomy with patency of the ileal colonic anastomosis. An additional enteric anastomosis is seen within the left upper abdominal quadrant (axial image 46, series 2, coronal images 23 through 32, series 5). No evidence of enteric obstruction. No pneumoperitoneum, pneumatosis or portal venous gas. No discrete areas of bowel wall thickening. Vascular/Lymphatic: Moderate to large amount of mixed calcified and noncalcified atherosclerotic plaque within a normal caliber abdominal aorta. The major branch vessels of the abdominal aorta appear patent on this non CTA examination. There is apparent masslike hypertrophy of the fat within the left-side of the abdominal mesentery within the left lower abdominal quadrant which is ill-defined though measures approximately 13.0 x 6.3 x 12 cm (as measured in greatest oblique axial image 46, series 2 and coronal - image 22, series 5) and appears to results in mass effect upon the adjacent bowel. There are no definitive thickened internal septations within this abdominal mesenteries nor is there a definitive swirling of the mesenteric vessels. No bulky retroperitoneal mesenteric, pelvic or inguinal lymphadenopathy. Reproductive: Normal appearance of the pelvic organs. No discrete adnexal lesion. No free fluid the pelvic cul-de-sac. Other: Tiny mesenteric  fat containing periumbilical hernia. Musculoskeletal: No acute or aggressive osseous abnormalities. IMPRESSION: 1. Punctate (approximately 7 mm) stone within the distal aspect the right ureter results in moderate upstream ureterectasis and pelvicaliectasis. 2. Solitary additional  punctate (approximately 5 mm) nonobstructing right-sided renal stone. No evidence of left-sided nephrolithiasis urinary obstruction. 3. Sequela of subtotal colectomy with additional enteric anastomosis within the right upper abdominal quadrant. No evidence of enteric obstruction at either of these locations. 4. Masslike hypertrophy of the fat within the left-side of the abdominal mesentery which results in mass effect upon the adjacent bowel though again does not result in enteric obstruction. Differential considerations are broad and include an intraperitoneal mesenteric fat containing hernia though a discrete fat containing lesion (lipoma/liposarcoma) cannot be entirely excluded on the basis of this examination. Comparison with prior outside examinations is recommended. Critical Value/emergent results were called by telephone at the time of interpretation on 05/10/2016 at 9:26 pm to Dr. Tanna Furry , who verbally acknowledged these results. Electronically Signed   By: Sandi Mariscal M.D.   On: 05/10/2016 21:29   Assessment & Plans: Abdominal pain associated with soft tissue mass left mid abdomen  MRI does not indicate malignancy  Plan to proceed with ex lap and excision of soft tissue mass this afternoon  Orders entered  The risks and benefits of the procedure have been discussed at length with the patient.  The patient understands the proposed procedure, potential alternative treatments, and the course of recovery to be expected.  All of the patient's questions have been answered at this time.  The patient wishes to proceed with surgery.   Earnstine Regal, MD, [facility] Office: [place] 05/12/2016

## 2016-05-12 NOTE — Anesthesia Procedure Notes (Signed)
Procedure Name: Intubation Performed by: Christien Berthelot J Pre-anesthesia Checklist: Patient identified, Emergency Drugs available, Suction available, Patient being monitored and Timeout performed Patient Re-evaluated:Patient Re-evaluated prior to inductionOxygen Delivery Method: Circle system utilized Preoxygenation: Pre-oxygenation with 100% oxygen Intubation Type: IV induction Ventilation: Mask ventilation without difficulty Laryngoscope Size: Mac and 4 Grade View: Grade I Tube type: Oral Tube size: 7.0 mm Number of attempts: 1 Airway Equipment and Method: Stylet Placement Confirmation: ETT inserted through vocal cords under direct vision,  positive ETCO2,  CO2 detector and breath sounds checked- equal and bilateral Secured at: 21 cm Tube secured with: Tape Dental Injury: Teeth and Oropharynx as per pre-operative assessment        

## 2016-05-12 NOTE — Brief Op Note (Signed)
05/10/2016 - 05/12/2016  3:42 PM  PATIENT:  Leslie Harrison  60 y.o. female  PRE-OPERATIVE DIAGNOSIS:  Abdominal pain, intra-abdominal soft tissue mass   POST-OPERATIVE DIAGNOSIS:  omental torsion  PROCEDURE:  Procedure(s): EXPLORATORY LAPAROTOMY  AND  OMENTECTOMY (N/A)  SURGEON:  Surgeon(s) and Role:    * Armandina Gemma, MD - Primary  ANESTHESIA:   general  EBL:  Total I/O In: [id number] [I.V.:1600; IV Piggyback:50] Out: 350 [Urine:200; Other:100; Blood:50]  BLOOD ADMINISTERED:none  DRAINS: none   LOCAL MEDICATIONS USED:  NONE  SPECIMEN:  Excision  DISPOSITION OF SPECIMEN:  PATHOLOGY  COUNTS:  YES  TOURNIQUET:  * No tourniquets in log *  DICTATION: .Other Dictation: Dictation Number [id number]  PLAN OF CARE: Admit to inpatient   PATIENT DISPOSITION:  PACU - hemodynamically stable.   Delay start of Pharmacological VTE agent (>24hrs) due to surgical blood loss or risk of bleeding: yes  Earnstine Regal, MD, [facility] Office: [phone]

## 2016-05-12 NOTE — Transfer of Care (Signed)
Immediate Anesthesia Transfer of Care Note  Patient: Leslie Harrison  Procedure(s) Performed: Procedure(s): EXPLORATORY LAPAROTOMY  AND  OMENTECTOMY (N/A)  Patient Location: PACU  Anesthesia Type:General  Level of Consciousness:  sedated, patient cooperative and responds to stimulation  Airway & Oxygen Therapy:Patient Spontanous Breathing and Patient connected to face mask oxgen  Post-op Assessment:  Report given to PACU RN and Post -op Vital signs reviewed and stable  Post vital signs:  Reviewed and stable  Last Vitals:  Vitals:   05/12/16 0414 05/12/16 1530  BP: (!) 145/66   Pulse: 79   Resp: 16   Temp: 37.2 C [id number] C    Complications: No apparent anesthesia complications

## 2016-05-12 NOTE — Anesthesia Preprocedure Evaluation (Addendum)
Anesthesia Evaluation  Patient identified by MRN, date of birth, ID band Patient awake    Reviewed: Allergy & Precautions, H&P , NPO status , Patient's Chart, lab work & pertinent test results  Airway Mallampati: II  TM Distance: >3 FB Neck ROM: Full    Dental no notable dental hx. (+) Teeth Intact, Dental Advisory Given   Pulmonary neg pulmonary ROS,    Pulmonary exam normal breath sounds clear to auscultation       Cardiovascular hypertension, Pt. on medications  Rhythm:Regular Rate:Normal     Neuro/Psych Depression negative neurological ROS     GI/Hepatic Neg liver ROS, PUD,   Endo/Other  negative endocrine ROS  Renal/GU Renal InsufficiencyRenal disease  negative genitourinary   Musculoskeletal   Abdominal   Peds  Hematology negative hematology ROS (+)   Anesthesia Other Findings   Reproductive/Obstetrics negative OB ROS                            Anesthesia Physical Anesthesia Plan  ASA: III  Anesthesia Plan: General   Post-op Pain Management:    Induction: Intravenous, Rapid sequence and Cricoid pressure planned  Airway Management Planned: Oral ETT  Additional Equipment:   Intra-op Plan:   Post-operative Plan: Extubation in OR  Informed Consent: I have reviewed the patients History and Physical, chart, labs and discussed the procedure including the risks, benefits and alternatives for the proposed anesthesia with the patient or authorized representative who has indicated his/her understanding and acceptance.   Dental advisory given  Plan Discussed with: CRNA  Anesthesia Plan Comments:         Anesthesia Quick Evaluation

## 2016-05-13 LAB — BASIC METABOLIC PANEL
ANION GAP: 6 (ref 5–15)
BUN: 7 mg/dL (ref 6–20)
CALCIUM: 7.9 mg/dL — AB (ref 8.9–10.3)
CO2: 26 mmol/L (ref 22–32)
Chloride: 107 mmol/L (ref 101–111)
Creatinine, Ser: 0.93 mg/dL (ref 0.44–1.00)
GFR calc Af Amer: >60 mL/min (ref >60)
GFR calc non Af Amer: >60 mL/min (ref >60)
GLUCOSE: 143 mg/dL — AB (ref 65–99)
Potassium: 3.8 mmol/L (ref 3.5–5.1)
Sodium: 139 mmol/L (ref 135–145)

## 2016-05-13 NOTE — Op Note (Signed)
NAMECAIT, REDDIG NO.:  [id number]  MEDICAL RECORD NO.:  [id number]  LOCATION:  [id number]                         FACILITY:  [facility]  PHYSICIAN:  Earnstine Regal, MD      DATE OF BIRTH:  09/27/1956  DATE OF PROCEDURE:  05/12/2016                              OPERATIVE REPORT   PREOPERATIVE DIAGNOSES:  Abdominal pain, intraabdominal soft tissue mass.  POSTOPERATIVE DIAGNOSIS:  Omental torsion.  PROCEDURE:  Exploratory laparotomy, omentectomy.  SURGEON:  Earnstine Regal, MD, FACS  ANESTHESIA:  General.  ESTIMATED BLOOD LOSS:  Minimal.  PREPARATION:  ChloraPrep.  COMPLICATIONS:  None.  INDICATIONS:  The patient is a 60 year old female who underwent subtotal colectomy for ulcerative colitis at the [facility] over 10 years ago.  The patient was admitted to the hospital with a 3-day history of abdominal pain.  CT scan demonstrated a soft tissue mass in the mid left abdomen of undetermined etiology.  General Surgery was consulted.  The patient had persistent severe pain.  MRI scan was obtained to evaluate for possible liposarcoma.  This was negative.  The patient was therefore prepared and brought to the operating room for exploration for persistent pain.  BODY OF REPORT:  Procedure was done in OR #2 at the [facility].  The patient was brought to the operating room, placed in the supine position on the operating room table.  Following administration of general anesthesia, the patient was prepped and draped in the usual aseptic fashion.  After ascertaining that an adequate level of anesthesia had been achieved, midline abdominal incision was made with a #10 blade.  Dissection was carried through subcutaneous tissues. Fascia was incised in the midline and the peritoneal cavity was entered cautiously.  There was a moderate amount of ascitic fluid present within the peritoneal cavity, approximately 500 mL.  There  are inflammatory adhesions between loops of small bowel.  There was a large soft tissue mass in the central abdomen extending towards the left side.  This was gently mobilized.  Adherent loops of small bowel are gently mobilized. After exploration, this appears to be an ischemic or infarcted omentum arising off the greater curvature of the stomach and attached to the retroperitoneum.  Likely, there is torsion resulting in ischemia.  The omentum was mobilized and the omentum was divided along the greater curvature of the stomach between Kelly clamps and ligated with 2-0 silk ties.  The omentum was also excised off its attachments to the retroperitoneum between Kelly clamps and ligated with 2-0 silk ties. The entire mass was removed measuring approximately 15 cm in greatest dimension.  It is submitted in its entirety to Pathology for review.  Good hemostasis was obtained throughout the abdominal cavity.  Remaining ascitic fluid was evacuated.  Small bowel was inspected and is all viable.  There was no obvious point of obstruction.  Small bowel was returned to the peritoneal cavity.  Midline fascia was closed with interrupted #1 Novafil simple sutures. Subcutaneous tissues were irrigated.  Skin was closed with stainless steel staples.  Honeycomb dressing was applied.  The patient was awakened from anesthesia and  brought to the recovery room.  The patient tolerated the procedure well.   Earnstine Regal, MD, [facility] Office: [phone]    TMG/MEDQ  D:  05/12/2016  T:  05/12/2016  Job:  [id number]

## 2016-05-13 NOTE — Progress Notes (Signed)
Patient ID: Leslie Harrison, female   DOB: 03-16-1956, 60 y.o.   MRN: [id number]  [facility]  Subjective: POD#1 Patient in bed, sister at bedside.  Mild pain.  NG in place with bilious output.  Objective: Vital signs in last 24 hours: Temp:  [98.2 F (36.8 C)-99.6 F (37.6 C)] 98.7 F (37.1 C) (07/28 0504) Pulse Rate:  [73-113] 113 (07/28 0504) Resp:  [11-23] 23 (07/28 0800) BP: (106-151)/(48-75) 141/74 (07/28 0504) SpO2:  [91 %-97 %] 92 % (07/28 0800) Last BM Date: 05/10/16  Intake/Output from previous day: 07/27 0701 - 07/28 0700 In: 2380 [P.O.:120; I.V.:2210; IV Piggyback:50] Out: 1115 [Urine:540; Emesis/NG output:425; Blood:50] Intake/Output this shift: No intake/output data recorded.  Physical Exam: HEENT - sclerae clear, mucous membranes moist Neck - soft Chest - clear bilaterally Cor - RRR Abdomen - softer, less distended; wound dry and intact Ext - no edema, non-tender Neuro - alert & oriented, no focal deficits  Lab Results:   Recent Labs  05/11/16 0311 05/12/16 0441  WBC 12.2* 12.2*  HGB 11.7* 11.4*  HCT 34.9* 34.5*  PLT 193 195   BMET  Recent Labs  05/11/16 0311 05/12/16 0441 05/13/16 0419  NA 138  --  139  K 3.9  --  3.8  CL 106  --  107  CO2 26  --  26  GLUCOSE 131*  --  143*  BUN 15  --  7  CREATININE 1.12* 1.04* 0.93  CALCIUM 8.3*  --  7.9*   PT/INR No results for input(s): LABPROT, INR in the last 72 hours. Comprehensive Metabolic Panel:    Component Value Date/Time   NA 139 05/13/2016 0419   NA 138 05/11/2016 0311   K 3.8 05/13/2016 0419   K 3.9 05/11/2016 0311   CL 107 05/13/2016 0419   CL 106 05/11/2016 0311   CO2 26 05/13/2016 0419   CO2 26 05/11/2016 0311   BUN 7 05/13/2016 0419   BUN 15 05/11/2016 0311   CREATININE 0.93 05/13/2016 0419   CREATININE 1.04 (H) 05/12/2016 0441   GLUCOSE 143 (H) 05/13/2016 0419   GLUCOSE 131 (H) 05/11/2016 0311   CALCIUM 7.9 (L) 05/13/2016 0419    CALCIUM 8.3 (L) 05/11/2016 0311   AST 20 05/11/2016 0311   AST 25 05/10/2016 1847   ALT 16 05/11/2016 0311   ALT 18 05/10/2016 1847   ALKPHOS 78 05/11/2016 0311   ALKPHOS 97 05/10/2016 1847   BILITOT 1.5 (H) 05/11/2016 0311   BILITOT 1.4 (H) 05/10/2016 1847   PROT 7.1 05/11/2016 0311   PROT 8.4 (H) 05/10/2016 1847   ALBUMIN 3.6 05/11/2016 0311   ALBUMIN 4.2 05/10/2016 1847    Studies/Results: Mr Abdomen W Wo Contrast  Result Date: 05/11/2016 CLINICAL DATA:  Fatty lesion and left mid abdomen on CT, abdominal pain EXAM: MRI ABDOMEN WITHOUT AND WITH CONTRAST TECHNIQUE: Multiplanar multisequence MR imaging of the abdomen was performed both before and after the administration of intravenous contrast. CONTRAST:  15 mL Multihance IV COMPARISON:  CT abdomen pelvis dated 05/10/2016 FINDINGS: Motion degraded images. Lower chest: Mild eventration of the right hemidiaphragm. Lung bases are clear. Hepatobiliary: Liver is within normal limits.  No hepatic steatosis. Gallbladder is unremarkable. No intrahepatic or extrahepatic ductal dilatation. Pancreas: Within normal limits. Spleen: Within normal limits. Adrenals/Urinary Tract: Adrenal glands are within normal limits. Scarring along the right upper kidney. 5 mm nonobstructing right upper pole renal calculus (series 6/image 17), better visualized on CT.  Moderate right hydronephrosis. 10 mm left upper pole renal cyst (series 6/image 8). No left hydronephrosis. Stomach/Bowel: Stomach and visualized bowel are unremarkable. Vascular/Lymphatic: No evidence abdominal aortic aneurysm. No suspicious abdominal lymphadenopathy. Other: No abdominal ascites. 6.6 x 13.0 x 13.0 cm fat density lesion in the left mid abdomen (series 6/ image 27). No solid components/soft tissue elements. No enhancement following contrast administration. Musculoskeletal: No focal osseous lesions. IMPRESSION: 6.6 x 13.0 x 13.0 cm fat density lesion in the left mid abdomen. No solid  components/soft tissue elements or enhancement on MRI. No overt malignant features. Differential considerations include lipoma (favored) and well-differentiated liposarcoma (not entirely excluded). While it is not possible to reliably distinguish between these two entities on imaging, the lack of overt malignant features is certainly reassuring. Electronically Signed   By: Julian Hy M.D.   On: 05/11/2016 19:33   Assessment & Plans: Status post ex lap with omentectomy for omental torsion  OOB, ambulate  NPO, NG decompression  PCA for pain control  Earnstine Regal, MD, [facility] Office: [place] 05/13/2016

## 2016-05-14 LAB — BASIC METABOLIC PANEL
ANION GAP: 5 (ref 5–15)
BUN: 7 mg/dL (ref 6–20)
CO2: 27 mmol/L (ref 22–32)
Calcium: 7.8 mg/dL — ABNORMAL LOW (ref 8.9–10.3)
Chloride: 107 mmol/L (ref 101–111)
Creatinine, Ser: 0.93 mg/dL (ref 0.44–1.00)
Glucose, Bld: 104 mg/dL — ABNORMAL HIGH (ref 65–99)
POTASSIUM: 3.7 mmol/L (ref 3.5–5.1)
SODIUM: 139 mmol/L (ref 135–145)

## 2016-05-14 MED ORDER — PHENOL 1.4 % MT LIQD
1.0000 | OROMUCOSAL | Status: DC | PRN
  Administered 2016-05-15: 1 via OROMUCOSAL
  Filled 2016-05-14: qty 177

## 2016-05-14 MED ORDER — CHLORHEXIDINE GLUCONATE 0.12 % MT SOLN
15.0000 mL | Freq: Two times a day (BID) | OROMUCOSAL | Status: DC
  Administered 2016-05-14 – 2016-05-17 (×6): 15 mL via OROMUCOSAL
  Filled 2016-05-14 (×6): qty 15

## 2016-05-14 MED ORDER — CETYLPYRIDINIUM CHLORIDE 0.05 % MT LIQD
7.0000 mL | Freq: Two times a day (BID) | OROMUCOSAL | Status: DC
  Administered 2016-05-14 – 2016-05-15 (×2): 7 mL via OROMUCOSAL

## 2016-05-14 MED ORDER — MENTHOL 3 MG MT LOZG
1.0000 | LOZENGE | OROMUCOSAL | Status: DC | PRN
  Administered 2016-05-15: 3 mg via ORAL
  Filled 2016-05-14: qty 9

## 2016-05-14 NOTE — Progress Notes (Signed)
Patient ID: Leslie Harrison, female   DOB: [birth year], 60 y.o.   MRN: [id number]  [facility]  Subjective: POD#2 Patient in bed.  Mild pain.  NG in place with bilious output.  Objective: Vital signs in last 24 hours: Temp:  [98.2 F (36.8 C)-99.3 F (37.4 C)] 98.2 F (36.8 C) (07/29 0456) Pulse Rate:  [64-94] 80 (07/29 0456) Resp:  [16-23] 18 (07/29 0800) BP: (117-138)/(56-77) 138/65 (07/29 0456) SpO2:  [93 %-100 %] 100 % (07/29 0800) FiO2 (%):  [41 %-45 %] 45 % (07/29 0400) Last BM Date: 05/10/16  Intake/Output from previous day: 07/28 0701 - 07/29 0700 In: 2331.7 [I.V.:2331.7] Out: 2225 [Urine:1200; Emesis/NG output:1025] Intake/Output this shift: Total I/O In: 0  Out: 400 [Urine:100; Emesis/NG output:300]  Physical Exam: Gen: NAD Abdomen - soft, mildly distended; wound dry and intact Ext - no edema, non-tender Neuro - alert & oriented, no focal deficits  Lab Results:   Recent Labs  05/12/16 0441  WBC 12.2*  HGB 11.4*  HCT 34.5*  PLT 195   BMET  Recent Labs  05/13/16 0419 05/14/16 0444  NA 139 139  K 3.8 3.7  CL 107 107  CO2 26 27  GLUCOSE 143* 104*  BUN 7 7  CREATININE 0.93 0.93  CALCIUM 7.9* 7.8*   PT/INR No results for input(s): LABPROT, INR in the last 72 hours. Comprehensive Metabolic Panel:    Component Value Date/Time   NA 139 05/14/2016 0444   NA 139 05/13/2016 0419   K 3.7 05/14/2016 0444   K 3.8 05/13/2016 0419   CL 107 05/14/2016 0444   CL 107 05/13/2016 0419   CO2 27 05/14/2016 0444   CO2 26 05/13/2016 0419   BUN 7 05/14/2016 0444   BUN 7 05/13/2016 0419   CREATININE 0.93 05/14/2016 0444   CREATININE 0.93 05/13/2016 0419   GLUCOSE 104 (H) 05/14/2016 0444   GLUCOSE 143 (H) 05/13/2016 0419   CALCIUM 7.8 (L) 05/14/2016 0444   CALCIUM 7.9 (L) 05/13/2016 0419   AST 20 05/11/2016 0311   AST 25 05/10/2016 1847   ALT 16 05/11/2016 0311   ALT 18 05/10/2016 1847   ALKPHOS 78 05/11/2016 0311   ALKPHOS 97 05/10/2016 1847   BILITOT 1.5 (H) 05/11/2016 0311   BILITOT 1.4 (H) 05/10/2016 1847   PROT 7.1 05/11/2016 0311   PROT 8.4 (H) 05/10/2016 1847   ALBUMIN 3.6 05/11/2016 0311   ALBUMIN 4.2 05/10/2016 1847    Studies/Results: No results found.  Assessment & Plans: Status post ex lap with omentectomy for omental torsion  OOB, ambulate  NPO, cont NG decompression  PCA for pain control    Amoura Ransier C. [phone]

## 2016-05-14 NOTE — Anesthesia Postprocedure Evaluation (Signed)
Anesthesia Post Note  Patient: Leslie Harrison  Procedure(s) Performed: Procedure(s) (LRB): EXPLORATORY LAPAROTOMY  AND  OMENTECTOMY (N/A)  Patient location during evaluation: PACU Anesthesia Type: General Level of consciousness: awake and alert Pain management: pain level controlled Vital Signs Assessment: post-procedure vital signs reviewed and stable Respiratory status: spontaneous breathing, nonlabored ventilation, respiratory function stable and patient connected to nasal cannula oxygen Cardiovascular status: blood pressure returned to baseline and stable Postop Assessment: no signs of nausea or vomiting Anesthetic complications: no    Last Vitals:  Vitals:   05/14/16 0400 05/14/16 0456  BP:  138/65  Pulse:  80  Resp: 18 16  Temp:  36.8 C    Last Pain:  Vitals:   05/14/16 0456  TempSrc: Oral  PainSc:                  [name],W. EDMOND

## 2016-05-15 MED ORDER — MORPHINE SULFATE (PF) 2 MG/ML IV SOLN
2.0000 mg | INTRAVENOUS | Status: DC | PRN
  Administered 2016-05-15 – 2016-05-16 (×3): 2 mg via INTRAVENOUS
  Filled 2016-05-15 (×4): qty 1

## 2016-05-15 NOTE — Progress Notes (Signed)
Patient ID: GRACEN BEDIENT, female   DOB: 01/17/1956, 60 y.o.   MRN: [id number]  [facility]  Subjective: POD#3 Patient in bed.  Mild pain.  NG in place with bilious output.  Ambulating some  Objective: Vital signs in last 24 hours: Temp:  [98.4 F (36.9 C)-99.5 F (37.5 C)] 99.1 F (37.3 C) (07/30 0540) Pulse Rate:  [72-83] 77 (07/30 0540) Resp:  [15-20] 20 (07/30 0800) BP: (133-160)/(59-72) 160/72 (07/30 0540) SpO2:  [96 %-99 %] 97 % (07/30 0800) Last BM Date: 05/10/16  Intake/Output from previous day: 07/29 0701 - 07/30 0700 In: 2000 [I.V.:2000] Out: 2050 [Urine:650; Emesis/NG output:1400] Intake/Output this shift: No intake/output data recorded.  Physical Exam: Gen: NAD Abdomen - soft, mildly distended; wound dry and intact Ext - no edema, non-tender Neuro - alert & oriented, no focal deficits  Lab Results:  No results for input(s): WBC, HGB, HCT, PLT in the last 72 hours. BMET  Recent Labs  05/13/16 0419 05/14/16 0444  NA 139 139  K 3.8 3.7  CL 107 107  CO2 26 27  GLUCOSE 143* 104*  BUN 7 7  CREATININE 0.93 0.93  CALCIUM 7.9* 7.8*   PT/INR No results for input(s): LABPROT, INR in the last 72 hours. Comprehensive Metabolic Panel:    Component Value Date/Time   NA 139 05/14/2016 0444   NA 139 05/13/2016 0419   K 3.7 05/14/2016 0444   K 3.8 05/13/2016 0419   CL 107 05/14/2016 0444   CL 107 05/13/2016 0419   CO2 27 05/14/2016 0444   CO2 26 05/13/2016 0419   BUN 7 05/14/2016 0444   BUN 7 05/13/2016 0419   CREATININE 0.93 05/14/2016 0444   CREATININE 0.93 05/13/2016 0419   GLUCOSE 104 (H) 05/14/2016 0444   GLUCOSE 143 (H) 05/13/2016 0419   CALCIUM 7.8 (L) 05/14/2016 0444   CALCIUM 7.9 (L) 05/13/2016 0419   AST 20 05/11/2016 0311   AST 25 05/10/2016 1847   ALT 16 05/11/2016 0311   ALT 18 05/10/2016 1847   ALKPHOS 78 05/11/2016 0311   ALKPHOS 97 05/10/2016 1847   BILITOT 1.5 (H) 05/11/2016 0311   BILITOT 1.4  (H) 05/10/2016 1847   PROT 7.1 05/11/2016 0311   PROT 8.4 (H) 05/10/2016 1847   ALBUMIN 3.6 05/11/2016 0311   ALBUMIN 4.2 05/10/2016 1847    Studies/Results: No results found.  Assessment & Plans: Status post ex lap with omentectomy for omental torsion  OOB, ambulate  NPO, cont NG decompression  D/C PCA per pt request    Jaleeah Slight C. [phone]

## 2016-05-16 LAB — BASIC METABOLIC PANEL
ANION GAP: 7 (ref 5–15)
BUN: 9 mg/dL (ref 6–20)
CO2: 25 mmol/L (ref 22–32)
Calcium: 8.4 mg/dL — ABNORMAL LOW (ref 8.9–10.3)
Chloride: 108 mmol/L (ref 101–111)
Creatinine, Ser: 0.86 mg/dL (ref 0.44–1.00)
GFR calc Af Amer: >60 mL/min (ref >60)
GFR calc non Af Amer: >60 mL/min (ref >60)
GLUCOSE: 111 mg/dL — AB (ref 65–99)
POTASSIUM: 3.7 mmol/L (ref 3.5–5.1)
Sodium: 140 mmol/L (ref 135–145)

## 2016-05-16 LAB — CBC
HEMATOCRIT: 31.9 % — AB (ref 36.0–46.0)
Hemoglobin: 10.8 g/dL — ABNORMAL LOW (ref 12.0–15.0)
MCH: 31.9 pg (ref 26.0–34.0)
MCHC: 33.9 g/dL (ref 30.0–36.0)
MCV: 94.1 fL (ref 78.0–100.0)
Platelets: 327 10*3/uL (ref 150–400)
RBC: 3.39 MIL/uL — AB (ref 3.87–5.11)
RDW: 13.9 % (ref 11.5–15.5)
WBC: 10.2 10*3/uL (ref 4.0–10.5)

## 2016-05-16 NOTE — Progress Notes (Signed)
4 Days Post-Op  Subjective: She looks great, and has some BS, no flautus so far.  She is up walking and her incision looks fine.     Objective: Vital signs in last 24 hours: Temp:  [98.1 F (36.7 C)-98.9 F (37.2 C)] 98.1 F (36.7 C) (07/31 0620) Pulse Rate:  [72-88] 76 (07/31 0620) Resp:  [18-20] 18 (07/31 0620) BP: (151-157)/(64-75) 157/64 (07/31 0620) SpO2:  [93 %-97 %] 97 % (07/31 0620) Last BM Date: 05/10/16 NPO 1150 per NG Voided x 14, no volume recorded Afebrile, VSS Labs OK  Intake/Output from previous day: 07/30 0701 - 07/31 0700 In: 2301.3 [I.V.:2301.3] Out: 1150 [Emesis/NG output:1150] Intake/Output this shift: No intake/output data recorded.  General appearance: alert, cooperative and no distress Resp: clear to auscultation bilaterally GI: soft, few BS, no flatus, incision looks fine.  Lab Results:   Recent Labs  05/16/16 0441  WBC 10.2  HGB 10.8*  HCT 31.9*  PLT 327    BMET  Recent Labs  05/14/16 0444 05/16/16 0441  NA 139 140  K 3.7 3.7  CL 107 108  CO2 27 25  GLUCOSE 104* 111*  BUN 7 9  CREATININE 0.93 0.86  CALCIUM 7.8* 8.4*   PT/INR No results for input(s): LABPROT, INR in the last 72 hours.   Recent Labs Lab 05/10/16 1847 05/11/16 0311  AST 25 20  ALT 18 16  ALKPHOS 97 78  BILITOT 1.4* 1.5*  PROT 8.4* 7.1  ALBUMIN 4.2 3.6     Lipase     Component Value Date/Time   LIPASE 22 05/10/2016 1847     Studies/Results: No results found.  Prior to Admission medications   Medication Sig Start Date End Date Taking? Authorizing Provider  atorvastatin (LIPITOR) 10 MG tablet Take 10 mg by mouth daily.   Yes Historical Provider, MD  calcium carbonate (OS-CAL - DOSED IN MG OF ELEMENTAL CALCIUM) 1250 (500 Ca) MG tablet Take 1 tablet by mouth daily with breakfast.   Yes Historical Provider, MD  Multiple Vitamin (MULTIVITAMIN) capsule Take 1 capsule by mouth daily.   Yes Historical Provider, MD  sertraline (ZOLOFT) 100 MG tablet  Take 150 mg by mouth daily.   Yes Historical Provider, MD  valsartan (DIOVAN) 160 MG tablet Take 160 mg by mouth daily.   Yes Historical Provider, MD      Medications: . antiseptic oral rinse  7 mL Mouth Rinse q12n4p  . chlorhexidine  15 mL Mouth Rinse BID  . enoxaparin (LOVENOX) injection  40 mg Subcutaneous Q24H   . dextrose 5 % and 0.45 % NaCl with KCl 20 mEq/L 75 mL/hr at 05/15/16 2018   Dyslipidemia  Hx of ulcerative colitis State II kidney disease - creatinine is stable here Hx of depression  Assessment/Plan Omental torsion S/p Exploratory laparotomy and omectomy, 05/12/16,Dr.Gerkin - POD 4 Post op ileus - NG still in place Hypertension FEN: NPO/IV fluids ID: Pre op only DVT: Lovenox  Plan:  Continue to mobilize, she sounds like her bowel is waking up.  Recheck labs in AM.     LOS: 5 days    Saahil Herbster 05/16/2016 [phone]

## 2016-05-17 LAB — COMPREHENSIVE METABOLIC PANEL
ALK PHOS: 95 U/L (ref 38–126)
ALT: 34 U/L (ref 14–54)
ANION GAP: 9 (ref 5–15)
AST: 54 U/L — ABNORMAL HIGH (ref 15–41)
Albumin: 3 g/dL — ABNORMAL LOW (ref 3.5–5.0)
BILIRUBIN TOTAL: 1.1 mg/dL (ref 0.3–1.2)
BUN: 10 mg/dL (ref 6–20)
CALCIUM: 8.6 mg/dL — AB (ref 8.9–10.3)
CO2: 25 mmol/L (ref 22–32)
Chloride: 105 mmol/L (ref 101–111)
Creatinine, Ser: 0.66 mg/dL (ref 0.44–1.00)
Glucose, Bld: 120 mg/dL — ABNORMAL HIGH (ref 65–99)
Potassium: 3.7 mmol/L (ref 3.5–5.1)
Sodium: 139 mmol/L (ref 135–145)
TOTAL PROTEIN: 7 g/dL (ref 6.5–8.1)

## 2016-05-17 LAB — CBC
HEMATOCRIT: 34.2 % — AB (ref 36.0–46.0)
HEMOGLOBIN: 11.5 g/dL — AB (ref 12.0–15.0)
MCH: 31.5 pg (ref 26.0–34.0)
MCHC: 33.6 g/dL (ref 30.0–36.0)
MCV: 93.7 fL (ref 78.0–100.0)
Platelets: 388 10*3/uL (ref 150–400)
RBC: 3.65 MIL/uL — ABNORMAL LOW (ref 3.87–5.11)
RDW: 13.7 % (ref 11.5–15.5)
WBC: 11.8 10*3/uL — ABNORMAL HIGH (ref 4.0–10.5)

## 2016-05-17 LAB — PREALBUMIN: Prealbumin: 12 mg/dL — ABNORMAL LOW (ref 18–38)

## 2016-05-17 NOTE — Progress Notes (Signed)
5 Days Post-Op  Subjective: Doing better some stool and flatus.  Looks fine.  Incision is a little red  Objective: Vital signs in last 24 hours: Temp:  [98.4 F (36.9 C)-99.4 F (37.4 C)] 98.6 F (37 C) (08/01 0534) Pulse Rate:  [75-82] 79 (08/01 0534) Resp:  [18] 18 (08/01 0534) BP: (151-167)/(76-80) 151/80 (08/01 0534) SpO2:  [96 %-97 %] 96 % (08/01 0534) Last BM Date: 05/17/16 180 PO NG 1100 Stool 500 reported this AM Afebrile vital signs stable WBC is still mildly elevated  pre-albumin 12.0  Intake/Output from previous day: 07/31 0701 - 08/01 0700 In: 780 [P.O.:180; I.V.:600] Out: 1950 [Urine:850; Emesis/NG output:1100] Intake/Output this shift: Total I/O In: -  Out: 500 [Stool:500]  General appearance: alert, cooperative and no distress Resp: clear to auscultation bilaterally GI: Soft positive bowel sounds some flatus and stool this a.m. Incision is mildly erythematous.  Lab Results:   Recent Labs  05/16/16 0441 05/17/16 0446  WBC 10.2 11.8*  HGB 10.8* 11.5*  HCT 31.9* 34.2*  PLT 327 388    BMET  Recent Labs  05/16/16 0441 05/17/16 0446  NA 140 139  K 3.7 3.7  CL 108 105  CO2 25 25  GLUCOSE 111* 120*  BUN 9 10  CREATININE 0.86 0.66  CALCIUM 8.4* 8.6*   PT/INR No results for input(s): LABPROT, INR in the last 72 hours.   Recent Labs Lab 05/10/16 1847 05/11/16 0311 05/17/16 0446  AST 25 20 54*  ALT 18 16 34  ALKPHOS 97 78 95  BILITOT 1.4* 1.5* 1.1  PROT 8.4* 7.1 7.0  ALBUMIN 4.2 3.6 3.0*     Lipase     Component Value Date/Time   LIPASE 22 05/10/2016 1847     Studies/Results: No results found.  Medications: . antiseptic oral rinse  7 mL Mouth Rinse q12n4p  . chlorhexidine  15 mL Mouth Rinse BID  . enoxaparin (LOVENOX) injection  40 mg Subcutaneous Q24H   . dextrose 5 % and 0.45 % NaCl with KCl 20 mEq/L 75 mL/hr at 05/16/16 2351   Dyslipidemia  Hx of ulcerative colitis State II kidney disease - creatinine is stable  here Hx of depression  Assessment/Plan Omental torsion S/p Exploratory laparotomy and omectomy, 05/12/16,Dr.Gerkin - POD 5 Post op ileus - NG still in place Hypertension FEN: Clamping trial start some sips of clears/IV fluids ID: Pre op only DVT: Lovenox   Plan: Clamping trials and start her on some sips of clears this a.m.    LOS: 6 days    JENNINGS,WILLARD 05/17/2016 [phone]  NG clamped and drinking OK.  Will discontinue NG  Kaylyn Lim, MD, FACS

## 2016-05-18 MED ORDER — IBUPROFEN 200 MG PO TABS: ORAL_TABLET | ORAL | Status: DC

## 2016-05-18 MED ORDER — HYDROCODONE-ACETAMINOPHEN 5-325 MG PO TABS: 1.0000 | ORAL_TABLET | ORAL | 0 refills | Status: DC | PRN

## 2016-05-18 MED ORDER — ACETAMINOPHEN 325 MG PO TABS: 650.0000 mg | ORAL_TABLET | Freq: Four times a day (QID) | ORAL | Status: DC | PRN

## 2016-05-18 MED ORDER — VALSARTAN 160 MG PO TABS: ORAL_TABLET | ORAL | Status: DC

## 2016-05-18 MED ORDER — HYDROCODONE-ACETAMINOPHEN 5-325 MG PO TABS: 1.0000 | ORAL_TABLET | ORAL | Status: DC | PRN

## 2016-05-18 MED ORDER — IBUPROFEN 200 MG PO TABS: 400.0000 mg | ORAL_TABLET | Freq: Four times a day (QID) | ORAL | Status: DC | PRN

## 2016-05-18 NOTE — Progress Notes (Signed)
6 Days Post-Op  Subjective: She looks great. She feels much better. Tolerating clears without any difficulty. Having loose bowel movements every couple hours. Incision looks fine.  Objective: Vital signs in last 24 hours: Temp:  [98.2 F (36.8 C)-98.8 F (37.1 C)] 98.2 F (36.8 C) (08/02 0539) Pulse Rate:  [78-90] 78 (08/02 0539) Resp:  [14-18] 14 (08/02 0539) BP: (125-139)/(59-76) 125/62 (08/02 0539) SpO2:  [96 %-98 %] 96 % (08/02 0539) Last BM Date: 05/17/16 IV 1200 By mouth not recorded 900 stool recorded. Afebrile vital signs are stable WBC is still mildly elevated 11.8 Pre-albumin is 12   Intake/Output from previous day: 08/01 0701 - 08/02 0700 In: 1200 [I.V.:1200] Out: 900 [Stool:900] Intake/Output this shift: No intake/output data recorded.  General appearance: alert, cooperative and no distress Resp: clear to auscultation bilaterally GI: Soft, sore, multiple bowel movements, incision looks fine.  Lab Results:   Recent Labs  05/16/16 0441 05/17/16 0446  WBC 10.2 11.8*  HGB 10.8* 11.5*  HCT 31.9* 34.2*  PLT 327 388    BMET  Recent Labs  05/16/16 0441 05/17/16 0446  NA 140 139  K 3.7 3.7  CL 108 105  CO2 25 25  GLUCOSE 111* 120*  BUN 9 10  CREATININE 0.86 0.66  CALCIUM 8.4* 8.6*   PT/INR No results for input(s): LABPROT, INR in the last 72 hours.   Recent Labs Lab 05/17/16 0446  AST 54*  ALT 34  ALKPHOS 95  BILITOT 1.1  PROT 7.0  ALBUMIN 3.0*     Lipase     Component Value Date/Time   LIPASE 22 05/10/2016 1847     Studies/Results: No results found.  Medications: . antiseptic oral rinse  7 mL Mouth Rinse q12n4p  . chlorhexidine  15 mL Mouth Rinse BID  . enoxaparin (LOVENOX) injection  40 mg Subcutaneous Q24H   Dyslipidemia  Hx of ulcerative colitis State II kidney disease - creatinine is stable here Hx of depression  Assessment/Plan  Omental torsion S/p Exploratory laparotomy and omectomy, 05/12/16,Dr.Gerkin - POD  6 Post op ileus - NG still in place Hypertension FEN: NG was removed yesterday she has been on clears from the floor and now clears from downstairs. I'll go no advance her to a soft diet at lunch. ID: Pre op only DVT: Lovenox   Plan: Saline lock IV, advance to a soft diet, home later today or in the a.m.   LOS: 7 days    Geryl Dohn 05/18/2016 [phone]

## 2016-05-18 NOTE — Discharge Instructions (Signed)
[facility]      [facility] [phone]  OPEN ABDOMINAL SURGERY: POST OP INSTRUCTIONS  Always review your discharge instruction sheet given to you by the facility where your surgery was performed.  IF YOU HAVE DISABILITY OR FAMILY LEAVE FORMS, YOU MUST BRING THEM TO THE OFFICE FOR PROCESSING.  PLEASE DO NOT GIVE THEM TO YOUR DOCTOR.  1. A prescription for pain medication may be given to you upon discharge.  Take your pain medication as prescribed, if needed.  If narcotic pain medicine is not needed, then you may take acetaminophen (Tylenol) or ibuprofen (Advil) as needed. 2. Take your usually prescribed medications unless otherwise directed. 3. If you need a refill on your pain medication, please contact your pharmacy. They will contact our office to request authorization.  Prescriptions will not be filled after 5pm or on week-ends. 4. You should follow a light diet the first few days after arrival home, such as soup and crackers, pudding, etc.unless your doctor has advised otherwise. A high-fiber, low fat diet can be resumed as tolerated.   Be sure to include lots of fluids daily. Most patients will experience some swelling and bruising on the chest and neck area.  Ice packs will help.  Swelling and bruising can take several days to resolve 5. Most patients will experience some swelling and bruising in the area of the incision. Ice pack will help. Swelling and bruising can take several days to resolve..  6. It is common to experience some constipation if taking pain medication after surgery.  Increasing fluid intake and taking a stool softener will usually help or prevent this problem from occurring.  A mild laxative (Milk of Magnesia or Miralax) should be taken according to package directions if there are no bowel movements after 48 hours. 7.  You may have steri-strips (small skin tapes) in place directly over the incision.  These strips should be left on the skin for 7-10 days.  If your  surgeon used skin glue on the incision, you may shower in 24 hours.  The glue will flake off over the next 2-3 weeks.  Any sutures or staples will be removed at the office during your follow-up visit. You may find that a light gauze bandage over your incision may keep your staples from being rubbed or pulled. You may shower and replace the bandage daily. 8. ACTIVITIES:  You may resume regular (light) daily activities beginning the next day--such as daily self-care, walking, climbing stairs--gradually increasing activities as tolerated.  You may have sexual intercourse when it is comfortable.  Refrain from any heavy lifting or straining until approved by your doctor. a. You may drive when you no longer are taking prescription pain medication, you can comfortably wear a seatbelt, and you can safely maneuver your car and apply brakes b. Return to Work: ___________________________________ 9. You should see your doctor in the office for a follow-up appointment approximately two weeks after your surgery.  Make sure that you call for this appointment within a day or two after you arrive home to insure a convenient appointment time. OTHER INSTRUCTIONS:  _____________________________________________________________ _____________________________________________________________  WHEN TO CALL YOUR DOCTOR: 1. Fever over 101.0 2. Inability to urinate 3. Nausea and/or vomiting 4. Extreme swelling or bruising 5. Continued bleeding from incision. 6. Increased pain, redness, or drainage from the incision. 7. Difficulty swallowing or breathing 8. Muscle cramping or spasms. 9. Numbness or tingling in hands or feet or around lips.  The clinic staff is available to   answer your questions during regular business hours.  Please don't hesitate to call and ask to speak to one of the nurses if you have concerns.  For further questions, please visit [email or web address]   

## 2016-05-23 NOTE — Discharge Summary (Signed)
Physician Discharge Summary  Patient ID: Leslie Harrison MRN: [id number] DOB/AGE: 1956-04-24 60 y.o.  Admit date: 05/10/2016 Discharge date: 05/18/2016  Admission Diagnoses:  Omental torsion Post op ileus  Hypertension Dyslipidemia  Hx of ulcerative colitis State II kidney disease  7 mm right urethral stone Hx of depression  Discharge Diagnoses:  Omental torsion Post op ileus  7 mm right urethral stone Hypertension Dyslipidemia  Hx of ulcerative colitis State II kidney disease  Hx of depression   Principal Problem:   Abdominal pain Active Problems:   Ureteral stone   Essential hypertension   HLD (hyperlipidemia)   History of ulcerative colitis   PROCEDURES: S/p Exploratory laparotomy and omectomy, 05/12/16,Dr.Gerkin -   Hospital Course:  started experiencing abdominal pain 3-4 days ago. Patient's pain was initially diffuse which became more focused on the left side eventually. Has had one episode of nausea vomiting yesterday. Has had decreased bowel movements last few days which is unusual for her. In the ER CT abdomen shows right ureteric stone and mass in the left side. On calls general surgeon Dr. Redmond Pulling was consulted and patient is being admitted for further management. On exam patient is not in distress. Pain improved with IV narcotics.  She was admitted on 7/26 and taken to the OR on 05/12/16.  She underwent surgery listed above. Post op she had a prolonged ileus, but no other issues. She finally opened up and her diet was advanced fairly quickly after she opened up.  She was ultimately discharged on 05/18/16.   CBC Latest Ref Rng & Units 05/17/2016 05/16/2016 05/12/2016  WBC 4.0 - 10.5 K/uL 11.8(H) 10.2 12.2(H)  Hemoglobin 12.0 - 15.0 g/dL 11.5(L) 10.8(L) 11.4(L)  Hematocrit 36.0 - 46.0 % 34.2(L) 31.9(L) 34.5(L)  Platelets 150 - 400 K/uL 388 327 195    CMP Latest Ref Rng & Units 05/17/2016 05/16/2016 05/14/2016  Glucose 65 - 99 mg/dL 120(H) 111(H) 104(H)  BUN 6 - 20 mg/dL 10  9 7   Creatinine 0.44 - 1.00 mg/dL 0.66 0.86 0.93  Sodium 135 - 145 mmol/L 139 140 139  Potassium 3.5 - 5.1 mmol/L 3.7 3.7 3.7  Chloride 101 - 111 mmol/L 105 108 107  CO2 22 - 32 mmol/L 25 25 27   Calcium 8.9 - 10.3 mg/dL 8.6(L) 8.4(L) 7.8(L)  Total Protein 6.5 - 8.1 g/dL 7.0 - -  Total Bilirubin 0.3 - 1.2 mg/dL 1.1 - -  Alkaline Phos 38 - 126 U/L 95 - -  AST 15 - 41 U/L 54(H) - -  ALT 14 - 54 U/L 34 - -   Condition on D/C:  Improved  Omentum, resection other than for tumor - BENIGN FATTY AND VASCULAR SOFT TISSUE WITH ASSOCIATED ACUTE INFLAMMATION, CLINICALLY OMENTAL TORSION. - NO ATYPIA OR TUMOR SEEN    Disposition: 01-Home or Self Care     Medication List    TAKE these medications   acetaminophen 325 MG tablet Commonly known as:  TYLENOL Take 2 tablets (650 mg total) by mouth every 6 (six) hours as needed for mild pain, moderate pain, fever or headache.   atorvastatin 10 MG tablet Commonly known as:  LIPITOR Take 10 mg by mouth daily.   calcium carbonate 1250 (500 Ca) MG tablet Commonly known as:  OS-CAL - dosed in mg of elemental calcium Take 1 tablet by mouth daily with breakfast.   HYDROcodone-acetaminophen 5-325 MG tablet Commonly known as:  NORCO/VICODIN Take 1-2 tablets by mouth every 4 (four) hours as needed for moderate pain  or severe pain.   ibuprofen 200 MG tablet Commonly known as:  ADVIL,MOTRIN You can take 2-3 tablets every 6 hours as needed for pain.  You can buy this at any drug store.   multivitamin capsule Take 1 capsule by mouth daily.   sertraline 100 MG tablet Commonly known as:  ZOLOFT Take 150 mg by mouth daily.   valsartan 160 MG tablet Commonly known as:  DIOVAN Take your blood pressure a couple times a day at home.  Record them and then call your primary care and ask when to restart this medicine.  Your blood pressure here is running from 120-130/60's range here without the medicine. What changed:  how much to take  how to take  this  when to take this  additional instructions      Follow-up Information    Darran Gabay,JENNIFER, PA-C. Schedule an appointment as soon as possible for a visit in 3 day(s).   Specialty:  Family Medicine Why:  Record your blood pressure and pulse for the next few days, and then let Dr. Percell Miller know.  Let her decide when to restart your BP medicine. Contact information: [place] [address] [phone]        Ardis Hughs, MD. Schedule an appointment as soon as possible for a visit in 2 week(s).   Specialty:  Urology Contact information: [place] [address] [phone]        Earnstine Regal, MD .   Specialty:  General Surgery Why:  CAll tomorrow and ask for an appointment in 2 weeks.   Contact information: [address] [place] [address] [phone]           Signed: Earnstine Regal 05/23/2016, 2:15 PM

## 2016-06-07 ENCOUNTER — Other Ambulatory Visit: Payer: Self-pay | Admitting: Urology

## 2016-06-08 ENCOUNTER — Encounter (HOSPITAL_BASED_OUTPATIENT_CLINIC_OR_DEPARTMENT_OTHER): Payer: Self-pay | Admitting: *Deleted

## 2016-06-08 NOTE — Progress Notes (Signed)
NPO AFTER MN.  ARRIVE AT 0600.  NEEDS ISTAT 8 AND EKG.  WILL TAKE AM MEDS W/ SIPS OF WATER DOS AND IF NEEDED TAKE VICODIN/ TYLENOL.

## 2016-06-09 NOTE — Anesthesia Preprocedure Evaluation (Addendum)
Anesthesia Evaluation  Patient identified by MRN, date of birth, ID band Patient awake    Reviewed: Allergy & Precautions, H&P , NPO status , Patient's Chart, lab work & pertinent test results  History of Anesthesia Complications (+) PONV and history of anesthetic complications  Airway Mallampati: II  TM Distance: >3 FB Neck ROM: Full    Dental no notable dental hx. (+) Teeth Intact, Dental Advisory Given   Pulmonary former smoker,    Pulmonary exam normal breath sounds clear to auscultation       Cardiovascular hypertension, Pt. on medications  Rhythm:Regular Rate:Normal     Neuro/Psych Depression negative neurological ROS     GI/Hepatic Neg liver ROS, PUD,   Endo/Other  negative endocrine ROS  Renal/GU Renal InsufficiencyRenal disease  negative genitourinary   Musculoskeletal  (+) Arthritis ,   Abdominal   Peds  Hematology negative hematology ROS (+)   Anesthesia Other Findings   Reproductive/Obstetrics negative OB ROS                             Anesthesia Physical  Anesthesia Plan  ASA: III  Anesthesia Plan: General   Post-op Pain Management:    Induction: Intravenous  Airway Management Planned: LMA  Additional Equipment:   Intra-op Plan:   Post-operative Plan: Extubation in OR  Informed Consent: I have reviewed the patients History and Physical, chart, labs and discussed the procedure including the risks, benefits and alternatives for the proposed anesthesia with the patient or authorized representative who has indicated his/her understanding and acceptance.   Dental advisory given  Plan Discussed with: CRNA  Anesthesia Plan Comments:         Anesthesia Quick Evaluation

## 2016-06-10 ENCOUNTER — Encounter (HOSPITAL_BASED_OUTPATIENT_CLINIC_OR_DEPARTMENT_OTHER): Payer: Self-pay | Admitting: *Deleted

## 2016-06-10 ENCOUNTER — Ambulatory Visit (HOSPITAL_BASED_OUTPATIENT_CLINIC_OR_DEPARTMENT_OTHER): Payer: 59 | Admitting: Anesthesiology

## 2016-06-10 ENCOUNTER — Ambulatory Visit (HOSPITAL_BASED_OUTPATIENT_CLINIC_OR_DEPARTMENT_OTHER)
Admission: RE | Admit: 2016-06-10 | Discharge: 2016-06-10 | Disposition: A | Discharge status: Home / Self Care | Payer: 59 | Source: Ambulatory Visit | Attending: Urology | Admitting: Urology

## 2016-06-10 ENCOUNTER — Encounter (HOSPITAL_BASED_OUTPATIENT_CLINIC_OR_DEPARTMENT_OTHER)
Admission: RE | Disposition: A | Discharge status: Home / Self Care | Payer: Self-pay | Source: Ambulatory Visit | Attending: Urology

## 2016-06-10 DIAGNOSIS — N183 Chronic kidney disease, stage 3 (moderate): Secondary | ICD-10-CM | POA: Insufficient documentation

## 2016-06-10 DIAGNOSIS — I129 Hypertensive chronic kidney disease with stage 1 through stage 4 chronic kidney disease, or unspecified chronic kidney disease: Secondary | ICD-10-CM | POA: Diagnosis not present

## 2016-06-10 DIAGNOSIS — N132 Hydronephrosis with renal and ureteral calculous obstruction: Secondary | ICD-10-CM | POA: Diagnosis present

## 2016-06-10 DIAGNOSIS — I10 Essential (primary) hypertension: Secondary | ICD-10-CM | POA: Insufficient documentation

## 2016-06-10 DIAGNOSIS — Z87891 Personal history of nicotine dependence: Secondary | ICD-10-CM | POA: Insufficient documentation

## 2016-06-10 DIAGNOSIS — Z79899 Other long term (current) drug therapy: Secondary | ICD-10-CM | POA: Diagnosis not present

## 2016-06-10 DIAGNOSIS — F329 Major depressive disorder, single episode, unspecified: Secondary | ICD-10-CM | POA: Insufficient documentation

## 2016-06-10 DIAGNOSIS — E785 Hyperlipidemia, unspecified: Secondary | ICD-10-CM | POA: Insufficient documentation

## 2016-06-10 DIAGNOSIS — Z9049 Acquired absence of other specified parts of digestive tract: Secondary | ICD-10-CM | POA: Insufficient documentation

## 2016-06-10 HISTORY — DX: Calculus of kidney: N20.0

## 2016-06-10 HISTORY — DX: Nausea with vomiting, unspecified: R11.2

## 2016-06-10 HISTORY — PX: HOLMIUM LASER APPLICATION: SHX5852

## 2016-06-10 HISTORY — DX: Presence of spectacles and contact lenses: Z97.3

## 2016-06-10 HISTORY — DX: Other specified postprocedural states: Z98.890

## 2016-06-10 HISTORY — DX: Carpal tunnel syndrome, bilateral upper limbs: G56.03

## 2016-06-10 HISTORY — DX: Calculus of ureter: N20.1

## 2016-06-10 HISTORY — PX: CYSTOSCOPY WITH RETROGRADE PYELOGRAM, URETEROSCOPY AND STENT PLACEMENT: SHX5789

## 2016-06-10 HISTORY — DX: Hyperlipidemia, unspecified: E78.5

## 2016-06-10 HISTORY — DX: Trigger finger, right middle finger: M65.331

## 2016-06-10 HISTORY — PX: STONE EXTRACTION WITH BASKET: SHX5318

## 2016-06-10 HISTORY — DX: Unspecified osteoarthritis, unspecified site: M19.90

## 2016-06-10 HISTORY — DX: Elevated blood-pressure reading, without diagnosis of hypertension: R03.0

## 2016-06-10 HISTORY — DX: Presence of dental prosthetic device (complete) (partial): Z97.2

## 2016-06-10 HISTORY — DX: Anemia, unspecified: D64.9

## 2016-06-10 HISTORY — DX: Urgency of urination: R39.15

## 2016-06-10 LAB — POCT I-STAT, CHEM 8
BUN: 15 mg/dL (ref 6–20)
CALCIUM ION: 1.21 mmol/L (ref 1.12–1.23)
CHLORIDE: 105 mmol/L (ref 101–111)
Creatinine, Ser: 0.9 mg/dL (ref 0.44–1.00)
Glucose, Bld: 95 mg/dL (ref 65–99)
HEMATOCRIT: 37 % (ref 36.0–46.0)
Hemoglobin: 12.6 g/dL (ref 12.0–15.0)
POTASSIUM: 3.8 mmol/L (ref 3.5–5.1)
SODIUM: 141 mmol/L (ref 135–145)
TCO2: 22 mmol/L (ref 0–100)

## 2016-06-10 SURGERY — CYSTOURETEROSCOPY, WITH RETROGRADE PYELOGRAM AND STENT INSERTION
Anesthesia: General | Laterality: Right

## 2016-06-10 MED ORDER — PROPOFOL 10 MG/ML IV BOLUS
INTRAVENOUS | Status: AC
  Filled 2016-06-10: qty 20

## 2016-06-10 MED ORDER — ONDANSETRON HCL 4 MG/2ML IJ SOLN
INTRAMUSCULAR | Status: AC
  Filled 2016-06-10: qty 2

## 2016-06-10 MED ORDER — LACTATED RINGERS IV SOLN
INTRAVENOUS | Status: DC
  Administered 2016-06-10 (×2): via INTRAVENOUS
  Filled 2016-06-10: qty 1000

## 2016-06-10 MED ORDER — SCOPOLAMINE 1 MG/3DAYS TD PT72
1.0000 | MEDICATED_PATCH | TRANSDERMAL | Status: DC
  Administered 2016-06-10: 1 via TRANSDERMAL
  Filled 2016-06-10: qty 1

## 2016-06-10 MED ORDER — ONDANSETRON HCL 4 MG/2ML IJ SOLN
INTRAMUSCULAR | Status: DC | PRN
  Administered 2016-06-10: 4 mg via INTRAVENOUS

## 2016-06-10 MED ORDER — FENTANYL CITRATE (PF) 100 MCG/2ML IJ SOLN
25.0000 ug | INTRAMUSCULAR | Status: DC | PRN
  Filled 2016-06-10: qty 1

## 2016-06-10 MED ORDER — LIDOCAINE HCL (CARDIAC) 20 MG/ML IV SOLN
INTRAVENOUS | Status: AC
  Filled 2016-06-10: qty 5

## 2016-06-10 MED ORDER — MIDAZOLAM HCL 2 MG/2ML IJ SOLN
INTRAMUSCULAR | Status: AC
  Filled 2016-06-10: qty 2

## 2016-06-10 MED ORDER — SODIUM CHLORIDE 0.9 % IR SOLN
Status: DC | PRN
  Administered 2016-06-10: 3000 mL via INTRAVESICAL
  Administered 2016-06-10: 500 mL
  Administered 2016-06-10: 1000 mL via INTRAVESICAL

## 2016-06-10 MED ORDER — LIDOCAINE HCL (CARDIAC) 20 MG/ML IV SOLN
INTRAVENOUS | Status: DC | PRN
  Administered 2016-06-10: 20 mg via INTRAVENOUS
  Administered 2016-06-10: 60 mg via INTRAVENOUS

## 2016-06-10 MED ORDER — DEXTROSE 5 % IV SOLN
270.0000 mg | Freq: Once | INTRAVENOUS | Status: AC
  Administered 2016-06-10: 270 mg via INTRAVENOUS
  Filled 2016-06-10: qty 6.75

## 2016-06-10 MED ORDER — GENTAMICIN SULFATE 40 MG/ML IJ SOLN
5.0000 mg/kg | Freq: Once | INTRAVENOUS | Status: DC
  Filled 2016-06-10: qty 8.25

## 2016-06-10 MED ORDER — DEXAMETHASONE SODIUM PHOSPHATE 4 MG/ML IJ SOLN
INTRAMUSCULAR | Status: DC | PRN
Start: 2016-06-10 — End: 2016-06-10
  Administered 2016-06-10: 10 mg via INTRAVENOUS

## 2016-06-10 MED ORDER — IOHEXOL 300 MG/ML  SOLN
INTRAMUSCULAR | Status: DC | PRN
  Administered 2016-06-10: 9 mL via URETHRAL

## 2016-06-10 MED ORDER — DEXAMETHASONE SODIUM PHOSPHATE 10 MG/ML IJ SOLN
INTRAMUSCULAR | Status: AC
  Filled 2016-06-10: qty 1

## 2016-06-10 MED ORDER — MIDAZOLAM HCL 5 MG/5ML IJ SOLN
INTRAMUSCULAR | Status: DC | PRN
  Administered 2016-06-10: 2 mg via INTRAVENOUS

## 2016-06-10 MED ORDER — OXYCODONE-ACETAMINOPHEN 5-325 MG PO TABS: 1.0000 | ORAL_TABLET | Freq: Four times a day (QID) | ORAL | 0 refills | Status: DC | PRN

## 2016-06-10 MED ORDER — PROPOFOL 10 MG/ML IV BOLUS
INTRAVENOUS | Status: DC | PRN
  Administered 2016-06-10: 150 mg via INTRAVENOUS
  Administered 2016-06-10: 20 mg via INTRAVENOUS

## 2016-06-10 MED ORDER — FENTANYL CITRATE (PF) 100 MCG/2ML IJ SOLN
INTRAMUSCULAR | Status: AC
  Filled 2016-06-10: qty 2

## 2016-06-10 MED ORDER — PROMETHAZINE HCL 25 MG/ML IJ SOLN
6.2500 mg | INTRAMUSCULAR | Status: DC | PRN
  Filled 2016-06-10: qty 1

## 2016-06-10 MED ORDER — FENTANYL CITRATE (PF) 100 MCG/2ML IJ SOLN
INTRAMUSCULAR | Status: DC | PRN
  Administered 2016-06-10: 25 ug via INTRAVENOUS
  Administered 2016-06-10: 50 ug via INTRAVENOUS
  Administered 2016-06-10: 25 ug via INTRAVENOUS

## 2016-06-10 MED ORDER — EPHEDRINE SULFATE 50 MG/ML IJ SOLN
INTRAMUSCULAR | Status: DC | PRN
  Administered 2016-06-10 (×2): 10 mg via INTRAVENOUS

## 2016-06-10 MED ORDER — EPHEDRINE SULFATE 50 MG/ML IJ SOLN
INTRAMUSCULAR | Status: AC
  Filled 2016-06-10: qty 1

## 2016-06-10 MED ORDER — SCOPOLAMINE 1 MG/3DAYS TD PT72
MEDICATED_PATCH | TRANSDERMAL | Status: AC
  Filled 2016-06-10: qty 1

## 2016-06-10 MED ORDER — SULFAMETHOXAZOLE-TRIMETHOPRIM 800-160 MG PO TABS: 1.0000 | ORAL_TABLET | Freq: Two times a day (BID) | ORAL | 0 refills | Status: DC

## 2016-06-10 MED ORDER — GENTAMICIN IN SALINE 1.6-0.9 MG/ML-% IV SOLN
80.0000 mg | INTRAVENOUS | Status: DC
  Filled 2016-06-10: qty 50

## 2016-06-10 MED ORDER — SENNOSIDES-DOCUSATE SODIUM 8.6-50 MG PO TABS: 1.0000 | ORAL_TABLET | Freq: Two times a day (BID) | ORAL | 0 refills | Status: DC

## 2016-06-10 SURGICAL SUPPLY — 41 items
BAG DRAIN URO-CYSTO SKYTR STRL (DRAIN) ×2 IMPLANT
BASKET DAKOTA 1.9FR 11X120 (BASKET) IMPLANT
BASKET LASER NITINOL 1.9FR (BASKET) ×2 IMPLANT
BASKET STNLS GEMINI 4WIRE 3FR (BASKET) IMPLANT
BASKET ZERO TIP NITINOL 2.4FR (BASKET) IMPLANT
CATH INTERMIT  6FR 70CM (CATHETERS) ×2 IMPLANT
CATH URET 5FR 28IN CONE TIP (BALLOONS)
CATH URET 5FR 28IN OPEN ENDED (CATHETERS) IMPLANT
CATH URET 5FR 70CM CONE TIP (BALLOONS) IMPLANT
CLOTH BEACON ORANGE TIMEOUT ST (SAFETY) ×2 IMPLANT
ELECT REM PT RETURN 9FT ADLT (ELECTROSURGICAL)
ELECTRODE REM PT RTRN 9FT ADLT (ELECTROSURGICAL) IMPLANT
FIBER LASER FLEXIVA 365 (UROLOGICAL SUPPLIES) IMPLANT
FIBER LASER LITHO 273 (Laser) ×2 IMPLANT
FIBER LASER TRAC TIP (UROLOGICAL SUPPLIES) IMPLANT
GLOVE BIO SURGEON STRL SZ7.5 (GLOVE) ×2 IMPLANT
GOWN STRL REUS W/ TWL LRG LVL3 (GOWN DISPOSABLE) ×2 IMPLANT
GOWN STRL REUS W/ TWL XL LVL3 (GOWN DISPOSABLE) IMPLANT
GOWN STRL REUS W/TWL LRG LVL3 (GOWN DISPOSABLE) ×2
GOWN STRL REUS W/TWL XL LVL3 (GOWN DISPOSABLE)
GUIDEWIRE 0.038 PTFE COATED (WIRE) IMPLANT
GUIDEWIRE ANG ZIPWIRE 038X150 (WIRE) ×2 IMPLANT
GUIDEWIRE STR DUAL SENSOR (WIRE) ×2 IMPLANT
IV NS 1000ML (IV SOLUTION) ×1
IV NS 1000ML BAXH (IV SOLUTION) ×1 IMPLANT
IV NS IRRIG 3000ML ARTHROMATIC (IV SOLUTION) ×2 IMPLANT
KIT BALLIN UROMAX 15FX10 (LABEL) IMPLANT
KIT BALLN UROMAX 15FX4 (MISCELLANEOUS) IMPLANT
KIT BALLN UROMAX 26 75X4 (MISCELLANEOUS)
KIT ROOM TURNOVER WOR (KITS) ×2 IMPLANT
MANIFOLD NEPTUNE II (INSTRUMENTS) ×2 IMPLANT
NS IRRIG 500ML POUR BTL (IV SOLUTION) ×2 IMPLANT
PACK CYSTO (CUSTOM PROCEDURE TRAY) ×2 IMPLANT
PAD OB MATERNITY 4.3X12.25 (PERSONAL CARE ITEMS) ×2 IMPLANT
SET HIGH PRES BAL DIL (LABEL)
SHEATH ACCESS URETERAL 24CM (SHEATH) ×2 IMPLANT
STENT URET 6FRX22 CONTOUR (STENTS) ×2 IMPLANT
SYRINGE 10CC LL (SYRINGE) ×2 IMPLANT
SYRINGE IRR TOOMEY STRL 70CC (SYRINGE) IMPLANT
TUBE CONNECTING 12X1/4 (SUCTIONS) ×2 IMPLANT
TUBE FEEDING 8FR 16IN STR KANG (MISCELLANEOUS) ×2 IMPLANT

## 2016-06-10 NOTE — Addendum Note (Signed)
Addendum  created 06/10/16 1036 by Mechele Claude, CRNA   Anesthesia Intra Flowsheets edited

## 2016-06-10 NOTE — Discharge Instructions (Signed)
1 - You may have urinary urgency (bladder spasms) and bloody urine on / off with stent in place. This is normal. ° °2 - Call MD or go to ER for fever >102, severe pain / nausea / vomiting not relieved by medications, or acute change in medical status °[facility] °[phone] °Post Ureteroscopy With or Without Stent Instructions ° °Definitions: ° °Ureter: The duct that transports urine from the kidney to the bladder. °Stent:   A plastic hollow tube that is placed into the ureter, from the kidney to the                 bladder to prevent the ureter from swelling shut. ° °GENERAL INSTRUCTIONS: ° °Despite the fact that no skin incisions were used, the area around the ureter and bladder is raw and irritated. The stent is a foreign body which will further irritate the bladder wall. This irritation is manifested by increased frequency of urination, both day and night, and by an increase in the urge to urinate. In some, the urge to urinate is present almost always. Sometimes the urge is strong enough that you may not be able to stop yourself from urinating. The only real cure is to remove the stent and then give time for the bladder wall to heal which can't be done until the danger of the ureter swelling shut has passed, which varies. ° °You may see some blood in your urine while the stent is in place and a few days afterwards. Do not be alarmed, even if the urine was clear for a while. Get off your feet and drink lots of fluids until clearing occurs. If you start to pass clots or don't improve, call us. ° °DIET: °You may return to your normal diet immediately. Because of the raw surface of your bladder, alcohol, spicy foods, acid type foods and drinks with caffeine may cause irritation or frequency and should be used in moderation. To keep your urine flowing freely and to avoid constipation, drink plenty of fluids during the day ( 8-10 glasses ). °Tip: Avoid cranberry juice because it is very  acidic. ° °ACTIVITY: °Your physical activity doesn't need to be restricted. However, if you are very active, you may see some blood in your urine. We suggest that you reduce your activity under these circumstances until the bleeding has stopped. ° °BOWELS: °It is important to keep your bowels regular during the postoperative period. Straining with bowel movements can cause bleeding. A bowel movement every other day is reasonable. Use a mild laxative if needed, such as Milk of Magnesia 2-3 tablespoons, or 2 Dulcolax tablets. Call if you continue to have problems. If you have been taking narcotics for pain, before, during or after your surgery, you may be constipated. Take a laxative if necessary. ° ° °MEDICATION: °You should resume your pre-surgery medications unless told not to. In addition you will often be given an antibiotic to prevent infection. These should be taken as prescribed until the bottles are finished unless you are having an unusual reaction to one of the drugs. ° °PROBLEMS YOU SHOULD REPORT TO US: °· Fevers over 100.5 Fahrenheit. °· Heavy bleeding, or clots ( See above notes about blood in urine ). °· Inability to urinate. °· Drug reactions ( hives, rash, nausea, vomiting, diarrhea ). °· Severe burning or pain with urination that is not improving. ° °FOLLOW-UP: °You will need a follow-up appointment to monitor your progress. Call for this appointment at the number listed above.   Usually the first appointment will be about three to fourteen days after your surgery. ° ° ° ° ° °Post Anesthesia Home Care Instructions ° °Activity: °Get plenty of rest for the remainder of the day. A responsible adult should stay with you for 24 hours following the procedure.  °For the next 24 hours, DO NOT: °-Drive a car °-Operate machinery °-Drink alcoholic beverages °-Take any medication unless instructed by your physician °-Make any legal decisions or sign important papers. ° °Meals: °Start with liquid foods such as  gelatin or soup. Progress to regular foods as tolerated. Avoid greasy, spicy, heavy foods. If nausea and/or vomiting occur, drink only clear liquids until the nausea and/or vomiting subsides. Call your physician if vomiting continues. ° °Special Instructions/Symptoms: °Your throat may feel dry or sore from the anesthesia or the breathing tube placed in your throat during surgery. If this causes discomfort, gargle with warm salt water. The discomfort should disappear within 24 hours. ° °If you had a scopolamine patch placed behind your ear for the management of post- operative nausea and/or vomiting: ° °1. The medication in the patch is effective for 72 hours, after which it should be removed.  Wrap patch in a tissue and discard in the trash. Wash hands thoroughly with soap and water. °2. You may remove the patch earlier than 72 hours if you experience unpleasant side effects which may include dry mouth, dizziness or visual disturbances. °3. Avoid touching the patch. Wash your hands with soap and water after contact with the patch. °  ° °

## 2016-06-10 NOTE — Op Note (Signed)
NAMECLANCY, LAY NO.:  [id number]  MEDICAL RECORD NO.:  [id number]  LOCATION:                                 FACILITY:  PHYSICIAN:  Alexis Frock, MD     DATE OF BIRTH:  March 31, 1956  DATE OF PROCEDURE: 06/10/2016                              OPERATIVE REPORT  DIAGNOSIS:  Right ureteral and renal stones.  PROCEDURES: 1. Cystoscopy with right retrograde pyelogram and interpretation. 2. Right ureteroscopy with laser lithotripsy. 3. Insertion of right ureteral stent, 6 x 24, Contour, no tether.  ESTIMATED BLOOD LOSS:  Nil.  COMPLICATION:  None.  SPECIMENS:  Right ureteral and renal stone fragments for compositional analysis.  FINDINGS: 1. Significantly impacted right distal ureteral stone just proximal to     the ureterovesical junction with moderate hydroureteronephrosis. 2. Right upper lateral intrarenal stone. 3. Complete resolution of all stone fragments larger than 1/3rd mm     following laser lithotripsy and basket extraction. 4. Successful placement of right ureteral stent, proximal in the upper     pole, distal in the urinary bladder.  INDICATION:  Ms. Kildow is a very pleasant 60 year old lady with history of inflammatory bowel disease and recurrent nephrolithiasis.  She was found on workup of abdominal pain to have a significant right distal ureteral stone with proximal hydronephrosis and small right renal stone. She has undergone recent surgery for omental torsion, which she did well from; however, she has failed to pass her stone.  Options were discussed for management including continued medical therapy versus shockwave lithotripsy versus ureteroscopy, and she wished to proceed with the latter with goal of ipsilateral stone free.  Informed consent was obtained and placed in the medical record.  PROCEDURE IN DETAIL:  The patient lorey benesch was verified.  Procedure being right ureteroscopic stone manipulation was confirmed.  Procedure was  carried out.  Time-out was performed.  Intravenous antibiotics were administered.  General LMA anesthesia was introduced.  The patient was placed into a low lithotomy position and sterile field was created by prepping and draping the patient's vagina, introitus and proximal thighs using iodine.  Next, cystourethroscopy was performed using a 21-French rigid cystoscope with offset lens.  Inspection of the urinary bladder revealed no diverticula, calcifications, papillary lesions.  There was some ureteral edema as expected near the right ureteral orifice.  This was felt to be likely consistent with a distal ureteral stone.  The right ureter was cannulated with a 6-French end-hole catheter and right retrograde pyelogram was obtained.  Right retrograde pyelogram demonstrated a single right ureter with single-system right kidney.  There was a filling defect in the distal ureter consistent with known stone.  There was mild-to-moderate hydronephrosis above this.  There was a calcification seen on scout images corresponding to likely intrarenal stone in the lateral aspect of the kidney.  Several attempts were made to pass angle-tip Glidewire above the level of the stone; however, given its impaction, this was not successful.  As such, a feeding tube was placed in the urinary bladder for pressure release.  A semi-rigid ureteroscopy was performed in the distal right ureter alongside a Sensor working wire.  The stone  was indeed impacted very near the ureterovesical junction under direct ureteroscopic vision.  A wire could easily be passed alongside the stone up to the level of the upper pole, this set aside as a safety wire. Next, holmium laser lithotripsy was performed using settings of 0.2 joules and 20 Hz fragmenting the distal stone in approximately five smaller pieces.  These were then sequentially grasped on their long axis with an Escape basket and removed in their entirety, set aside  for compositional analysis.  Semi-rigid ureteroscopy of the distal two- thirds of the right ureter revealed some mild-to-moderate hydronephrosis and tortuosity as anticipated.  A separate Zip wire was advanced to the level of the upper pole, now, I think a safety wire and a 12/14, 24-cm ureteral access sheath was carefully placed using fluoroscopic guidance to the level of the proximal ureter and flexible digital ureteroscopy was performed.  Inspection of the right kidney, there was a single dominant calcification in a midlateral calyx as anticipated, this corresponded to a free-floating calyceal stone, this appeared to be too large for simple basketing.  As such, holmium laser energy was again applied to the stone fragmenting this into two smaller pieces, which were then grasped and brought out in their entirety.  Following these maneuvers, there was excellent hemostasis, complete resolution of all visible stone fragments.  The sheath was removed under continuous vision.  The area of prior stone impaction was visualized, there was significant edema in this area.  It was felt that interval stenting would be warranted.  As such, a new 6 x 22 Contour-type stent was placed using fluoroscopic guidance over the remaining safety wire, proximal in the upper pole and distal in the urinary bladder, and the procedure was terminated.  The patient tolerated the procedure well.  There were no immediate periprocedural complications.  The patient was taken to the postanesthesia care unit in stable condition.    ______________________________ Alexis Frock, MD   ______________________________ Alexis Frock, MD    TM/MEDQ  D:  06/10/2016  T:  06/10/2016  Job:  [id number]

## 2016-06-10 NOTE — Brief Op Note (Signed)
06/10/2016  8:33 AM  PATIENT:  Leslie Harrison  60 y.o. female  PRE-OPERATIVE DIAGNOSIS:  RIGHT URETERAL AND RENAL STONES  POST-OPERATIVE DIAGNOSIS:  RIGHT URETERAL AND RENAL STONES  PROCEDURE:  Procedure(s) with comments: CYSTOSCOPY WITH RETROGRADE PYELOGRAM, URETEROSCOPY AND STENT PLACEMENT (Right) - 75 MINS NEEDS DIGITAL URETEROSCOPE [phone] [id number] HOLMIUM LASER APPLICATION (Right) STONE EXTRACTION WITH BASKET (Right)  SURGEON:  Surgeon(s) and Role:    * Alexis Frock, MD - Primary  PHYSICIAN ASSISTANT:   ASSISTANTS: none   ANESTHESIA:   general  EBL:  Total I/O In: 800 [I.V.:800] Out: 10 [Blood:10]  BLOOD ADMINISTERED:none  DRAINS: none   LOCAL MEDICATIONS USED:  NONE  SPECIMEN:  Source of Specimen:  Rt ureteral and renal stone fragments  DISPOSITION OF SPECIMEN:  [facility] for compositional analysis  COUNTS:  YES  TOURNIQUET:  * No tourniquets in log *  DICTATION: .Other Dictation: Dictation Number [id number]  PLAN OF CARE: Discharge to home after PACU  PATIENT DISPOSITION:  PACU - hemodynamically stable.   Delay start of Pharmacological VTE agent (>24hrs) due to surgical blood loss or risk of bleeding: yes

## 2016-06-10 NOTE — Anesthesia Procedure Notes (Signed)
Procedure Name: LMA Insertion Date/Time: 06/10/2016 7:33 AM Performed by: Mechele Claude Pre-anesthesia Checklist: Patient identified, Emergency Drugs available, Suction available and Patient being monitored Patient Re-evaluated:Patient Re-evaluated prior to inductionOxygen Delivery Method: Circle system utilized Preoxygenation: Pre-oxygenation with 100% oxygen Intubation Type: IV induction Ventilation: Mask ventilation without difficulty LMA: LMA inserted LMA Size: 4.0 Number of attempts: 1 Airway Equipment and Method: Bite block Placement Confirmation: positive ETCO2 Tube secured with: Tape Dental Injury: Teeth and Oropharynx as per pre-operative assessment

## 2016-06-10 NOTE — Anesthesia Postprocedure Evaluation (Signed)
Anesthesia Post Note  Patient: Leslie Harrison  Procedure(s) Performed: Procedure(s) (LRB): CYSTOSCOPY WITH RETROGRADE PYELOGRAM, URETEROSCOPY AND STENT PLACEMENT (Right) HOLMIUM LASER APPLICATION (Right) STONE EXTRACTION WITH BASKET (Right)  Patient location during evaluation: PACU Anesthesia Type: General Level of consciousness: awake and alert Pain management: pain level controlled Vital Signs Assessment: post-procedure vital signs reviewed and stable Respiratory status: spontaneous breathing, nonlabored ventilation, respiratory function stable and patient connected to nasal cannula oxygen Cardiovascular status: blood pressure returned to baseline and stable Postop Assessment: no signs of nausea or vomiting Anesthetic complications: no    Last Vitals:  Vitals:   06/10/16 0842 06/10/16 0857  BP: (!) 156/71   Pulse: (!) 101 94  Resp: 13 10  Temp: 36.9 C     Last Pain:  Vitals:   06/10/16 0604  TempSrc: Oral                 Zenaida Deed

## 2016-06-10 NOTE — H&P (Signed)
Leslie Harrison is an 60 y.o. female.    Chief Complaint: Pre-op RIGHT ureteroscopic stone manipulation  HPI:   1 - Nephrolithiasis - 04/2016 - ER CT with 72m distal right ureteral stone with mild hydro (just below iliac crossing, not seen on scout images) and 514mnon-obstrucing renal parenchymal calcification. Given trial of medical therapy.   PMH sig for ulcerative colits s/p total colectomy and J pouch, ex-lap for omental torsion 2017, HLD, HTN. NO CV disease / blood thinners. 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 Her PCP is CaMaurice SmallD   Today "KaJossalynis seen to proceed with right ureteroscopy with goal of stone free. No interval fevers. Most recent UCX non-clonal.   Past Medical History:  Diagnosis Date  . Anemia   . Arthritis    hands  . Borderline hypertension   . Carpal tunnel syndrome, bilateral    R > L  . Depression   . Hyperlipidemia   . Kidney disease, chronic, stage II (mild, EGFR 60+ ml/min)   . Osteopenia   . PONV (postoperative nausea and vomiting)   . Renal calculus, right   . Right ureteral stone   . Trigger finger, right middle finger   . [place]  . Urgency of urination   . Wears glasses   . Wears partial dentures    lower    Past Surgical History:  Procedure Laterality Date  . COLOSTOMY TAKEDOWN  2005  . LAPAROTOMY N/A 05/12/2016   Procedure: EXPLORATORY LAPAROTOMY  AND  OMENTECTOMY;  Surgeon: ToArmandina GemmaMD;  Location: [facility];  Service: General;  Laterality: N/A;  . [place] TUB[place]  1978    Family History  Problem Relation Age of Onset  . Rectal cancer Father   . Ulcerative colitis Father   . Cervical cancer Maternal Grandmother    Social History:  reports that she quit smoking about 30 years ago. She quit after 10.00 years of use. She has never used smokeless tobacco. She reports that  she does not drink alcohol or use drugs.  Allergies: No Known Allergies  Medications Prior to Admission  Medication Sig Dispense Refill  . acetaminophen (TYLENOL) 325 MG tablet Take 2 tablets (650 mg total) by mouth every 6 (six) hours as needed for mild pain, moderate pain, fever or headache.    . calcium carbonate (OS-CAL - DOSED IN MG OF ELEMENTAL CALCIUM) 1250 (500 Ca) MG tablet Take 1 tablet by mouth daily with breakfast.    . HYDROcodone-acetaminophen (NORCO/VICODIN) 5-325 MG tablet Take 1-2 tablets by mouth every 4 (four) hours as needed for moderate pain or severe pain. 30 tablet 0  . ibuprofen (ADVIL,MOTRIN) 200 MG tablet You can take 2-3 tablets every 6 hours as needed for pain.  You can buy this at any drug store.    . Multiple Vitamin (MULTIVITAMIN) capsule Take 1 capsule by mouth daily.    . pravastatin (PRAVACHOL) 10 MG tablet Take 10 mg by mouth every morning.    . sertraline (ZOLOFT) 50 MG tablet Take 150 mg by mouth every morning.      Results for orders placed or performed during the hospital encounter of 06/10/16 (from the past 48 hour(s))  I-STAT, chem 8     Status: None   Collection Time: 06/10/16  6:23 AM  Result Value Ref Range  Sodium 141 135 - 145 mmol/L   Potassium 3.8 3.5 - 5.1 mmol/L   Chloride 105 101 - 111 mmol/L   BUN 15 6 - 20 mg/dL   Creatinine, Ser 0.90 0.44 - 1.00 mg/dL   Glucose, Bld 95 65 - 99 mg/dL   Calcium, Ion 1.21 1.12 - 1.23 mmol/L   TCO2 22 0 - 100 mmol/L   Hemoglobin 12.6 12.0 - 15.0 g/dL   HCT 37.0 36.0 - 46.0 %   No results found.  Review of Systems  Constitutional: Negative.   HENT: Negative.   Eyes: Negative.   Respiratory: Negative.   Cardiovascular: Negative.   Gastrointestinal: Negative.   Genitourinary: Positive for flank pain.  Skin: Negative.   Neurological: Negative.   Endo/Heme/Allergies: Negative.   Psychiatric/Behavioral: Negative.     Blood pressure (!) 148/73, pulse 66, temperature 97.9 F (36.6 C),  temperature source Oral, resp. rate 16, weight 66.2 kg (146 lb), SpO2 97 %. Physical Exam  Constitutional: She is oriented to person, place, and time. She appears well-developed.  HENT:  Head: Normocephalic.  Eyes: Pupils are equal, round, and reactive to light.  Neck: Normal range of motion.  Cardiovascular: Normal rate.   Respiratory: Effort normal.  GI:  Prior abd scars w/o hernias.   Genitourinary:  Genitourinary Comments: Mild Rt CVAT  Musculoskeletal: Normal range of motion.  Neurological: She is alert and oriented to person, place, and time.  Skin: Skin is warm.  Psychiatric: She has a normal mood and affect. Her behavior is normal. Judgment and thought content normal.     Assessment/Plan  She opts for ureteroscopy with goal of right side stone free todayas planned. Risks, benefits, expected peri-op course and possible need for temporary ureteral stent discussed.   Alexis Frock, MD 06/10/2016, 6:32 AM

## 2016-06-10 NOTE — Transfer of Care (Signed)
   Last Vitals:  Vitals:   06/10/16 0604 06/10/16 0842  BP: (!) 148/73 (!) (P) 156/71  Pulse: 66   Resp: 16   Temp: 36.6 C (P) 36.9 C    Last Pain:  Vitals:   06/10/16 0604  TempSrc: Oral      Patients Stated Pain Goal: 8 (06/10/16 [id number])  Immediate Anesthesia Transfer of Care Note  Patient: Leslie Harrison  Procedure(s) Performed: Procedure(s) (LRB): CYSTOSCOPY WITH RETROGRADE PYELOGRAM, URETEROSCOPY AND STENT PLACEMENT (Right) HOLMIUM LASER APPLICATION (Right) STONE EXTRACTION WITH BASKET (Right)  Patient Location: PACU  Anesthesia Type: General  Level of Consciousness: awake, alert  and oriented  Airway & Oxygen Therapy: Patient Spontanous Breathing and Patient connected to nasal cannula oxygen  Post-op Assessment: Report given to PACU RN and Post -op Vital signs reviewed and stable  Post vital signs: Reviewed and stable  Complications: No apparent anesthesia complications

## 2016-06-13 ENCOUNTER — Encounter (HOSPITAL_BASED_OUTPATIENT_CLINIC_OR_DEPARTMENT_OTHER): Payer: Self-pay | Admitting: Urology

## 2017-01-23 DIAGNOSIS — I10 Essential (primary) hypertension: Secondary | ICD-10-CM | POA: Diagnosis not present

## 2017-06-15 DIAGNOSIS — Z01419 Encounter for gynecological examination (general) (routine) without abnormal findings: Secondary | ICD-10-CM | POA: Diagnosis not present

## 2017-06-15 DIAGNOSIS — Z1231 Encounter for screening mammogram for malignant neoplasm of breast: Secondary | ICD-10-CM | POA: Diagnosis not present

## 2017-06-15 DIAGNOSIS — Z124 Encounter for screening for malignant neoplasm of cervix: Secondary | ICD-10-CM | POA: Diagnosis not present

## 2017-06-27 DIAGNOSIS — Z23 Encounter for immunization: Secondary | ICD-10-CM | POA: Diagnosis not present

## 2017-06-27 DIAGNOSIS — Z0000 Encounter for general adult medical examination without abnormal findings: Secondary | ICD-10-CM | POA: Diagnosis not present

## 2017-06-27 DIAGNOSIS — E785 Hyperlipidemia, unspecified: Secondary | ICD-10-CM | POA: Diagnosis not present

## 2017-06-27 DIAGNOSIS — I10 Essential (primary) hypertension: Secondary | ICD-10-CM | POA: Diagnosis not present

## 2017-08-16 DIAGNOSIS — L237 Allergic contact dermatitis due to plants, except food: Secondary | ICD-10-CM | POA: Diagnosis not present

## 2017-08-16 DIAGNOSIS — L282 Other prurigo: Secondary | ICD-10-CM | POA: Diagnosis not present

## 2018-01-29 DIAGNOSIS — D692 Other nonthrombocytopenic purpura: Secondary | ICD-10-CM | POA: Diagnosis not present

## 2018-01-29 DIAGNOSIS — L821 Other seborrheic keratosis: Secondary | ICD-10-CM | POA: Diagnosis not present

## 2018-02-25 IMAGING — MR MR ABDOMEN WO/W CM
10 of 18 series · 23 of 48 positions shown · IV contrast (multihance)
Comparison: CT abdomen pelvis dated 05/10/2016

CLINICAL DATA: Fatty lesion and left mid abdomen on CT, abdominal
pain

EXAM:
MRI ABDOMEN WITHOUT AND WITH CONTRAST
TECHNIQUE: Multiplanar multisequence MR imaging of the abdomen was performed
both before and after the administration of intravenous contrast.
CONTRAST:  15 mL Multihance IV

[Series 4: DWI b500 · axial · 6.0mm · 1.48mm/px · z∈[-47,+203]mm · 3 of 64 slices shown]
[im 1/64]
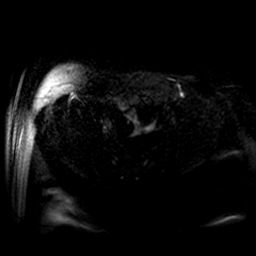
[im 32/64]
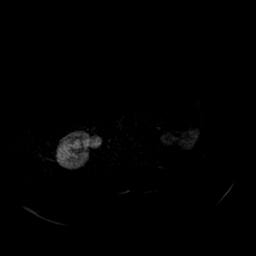
[im 64/64]
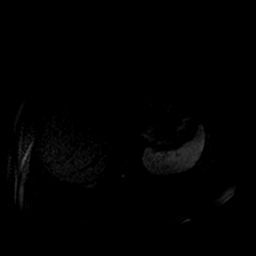

[Series 6: T2 fat-sat · axial · 5.0mm · 0.78mm/px · z∈[-48,+192]mm · 3 of 49 slices shown]
[im 1/49]
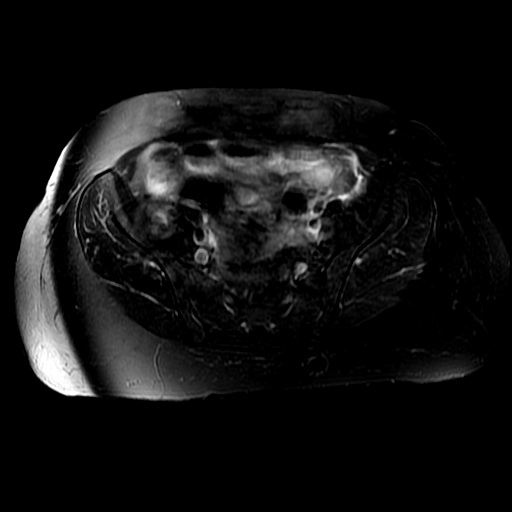
[im 25/49]
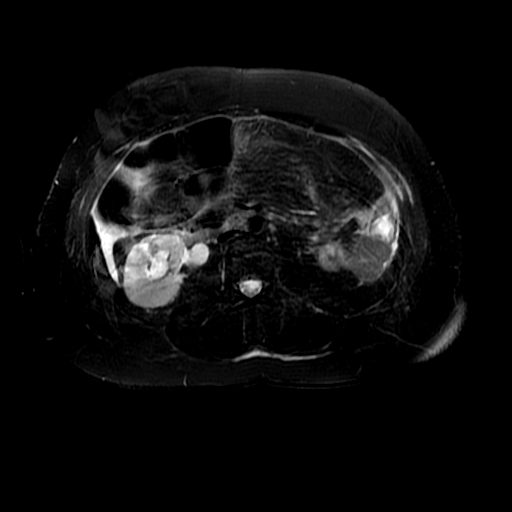
[im 49/49]
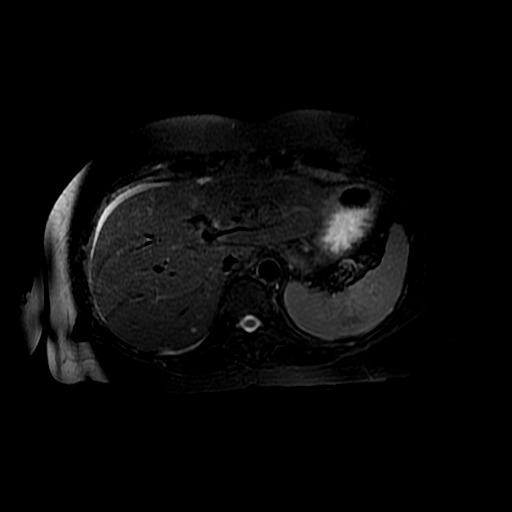

[Series 9: ax dualecho · axial · 5.0mm · 0.78mm/px · z∈[-19,+206]mm · 3 of 92 slices shown]
[im 1/92]
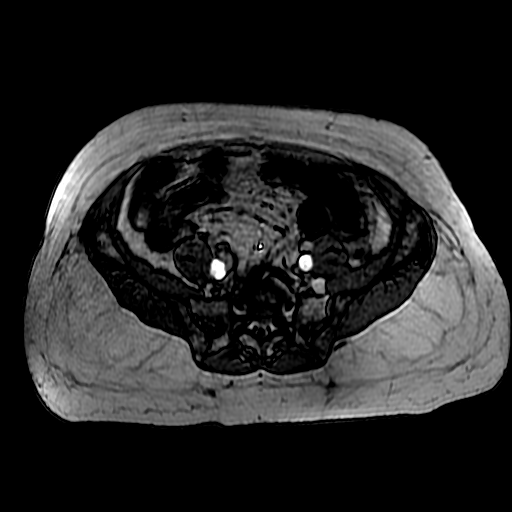
[im 46/92]
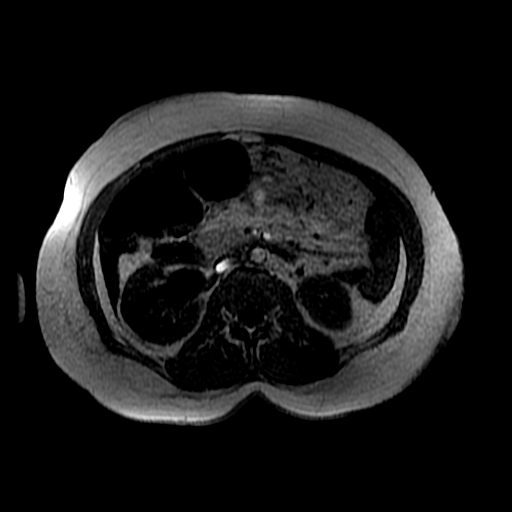
[im 92/92]
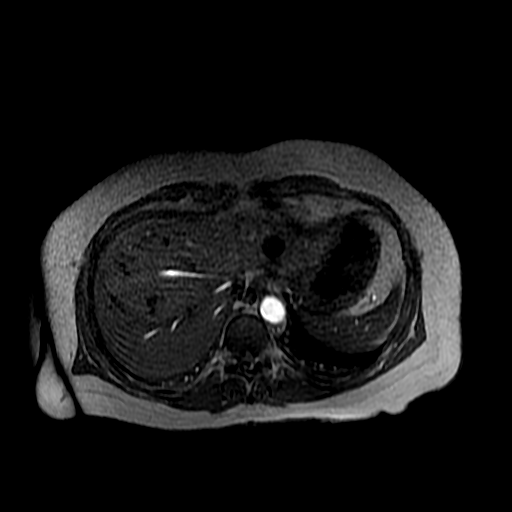

[Series 10: T2 · axial · 5.0mm · 0.78mm/px · z∈[-19,+206]mm · 2 of 46 slices shown (1 of 2)]
[im 1/46]
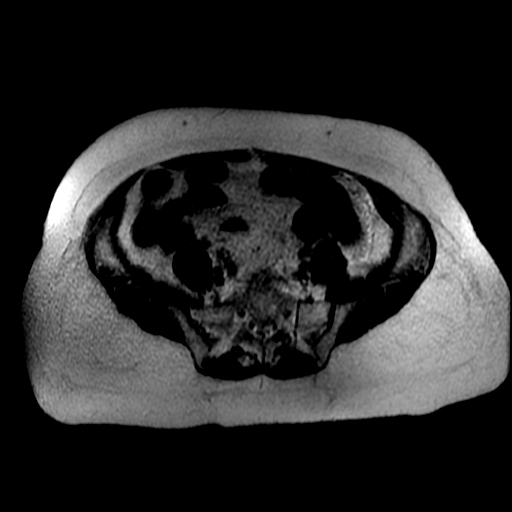
[im 46/46]
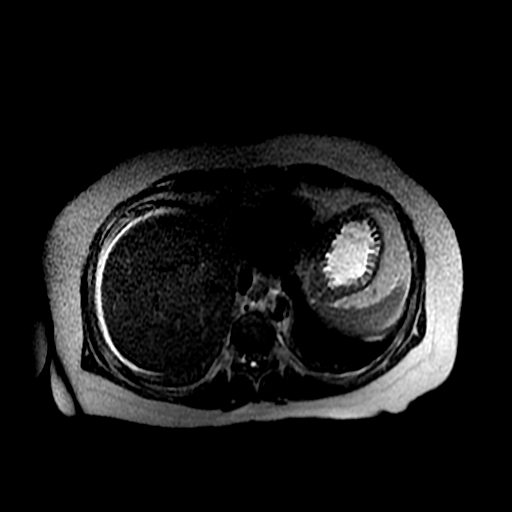

[Series 11: T2 · coronal · 5.0mm · 0.78mm/px · 1 of 39 slices shown (2 of 2)]
[im 1/39]
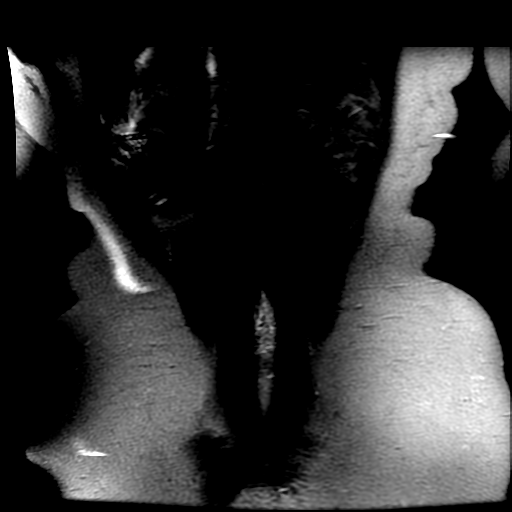

[Series 13: bSSFP · axial · 5.0mm · 1.56mm/px · z∈[-19,+206]mm · 2 of 46 slices shown]
[im 1/46]
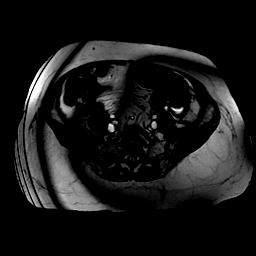
[im 46/46]
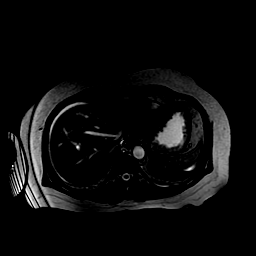

[Series 400: DWI · axial · 6.0mm · 1.48mm/px · 1 of 33 slices shown]
[im 1/33]
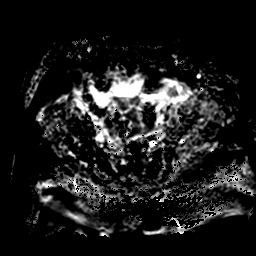

[Series 1400: T1 dynamic · axial · 5.0mm · 0.78mm/px · z∈[-30,+188]mm · 3 of 88 slices shown (1 of 3)]
[im 1/88]
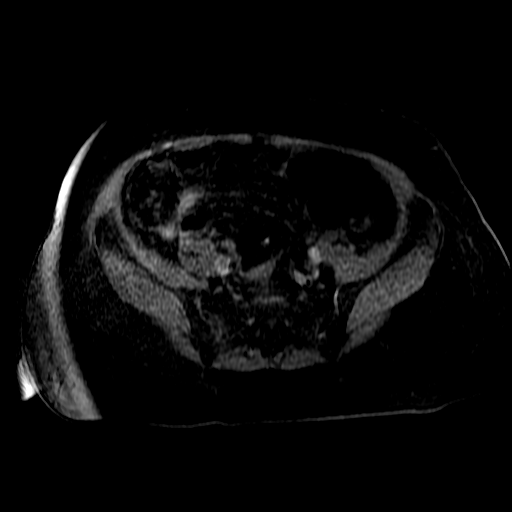
[im 44/88]
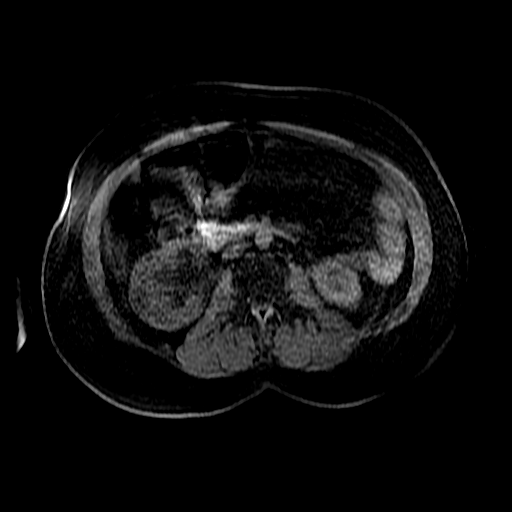
[im 88/88]
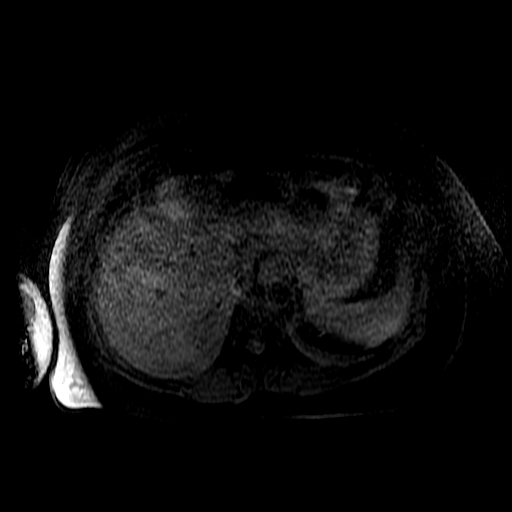

[Series 1401: T1 dynamic · axial · 5.0mm · 0.78mm/px · z∈[-30,+188]mm · 3 of 88 slices shown (2 of 3)]
[im 1/88]
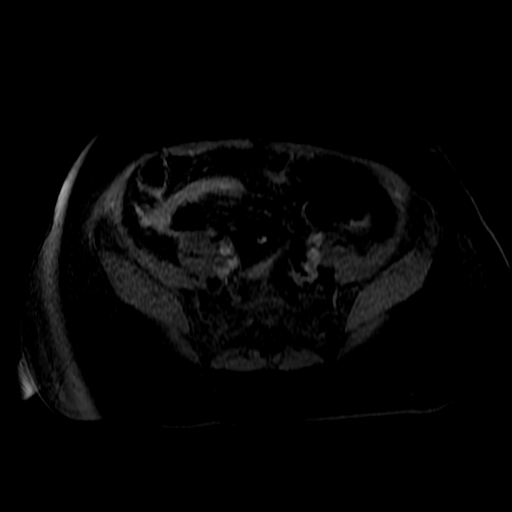
[im 44/88]
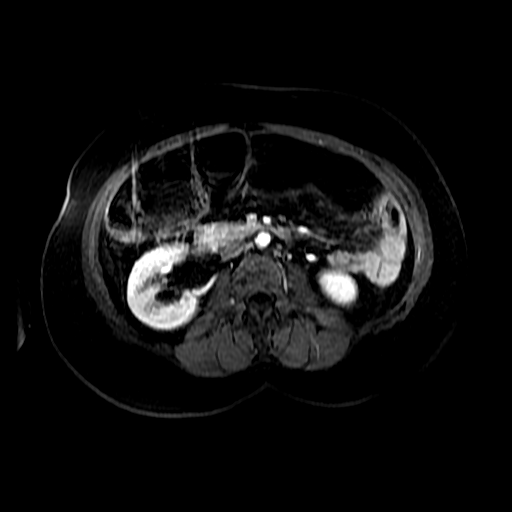
[im 88/88]
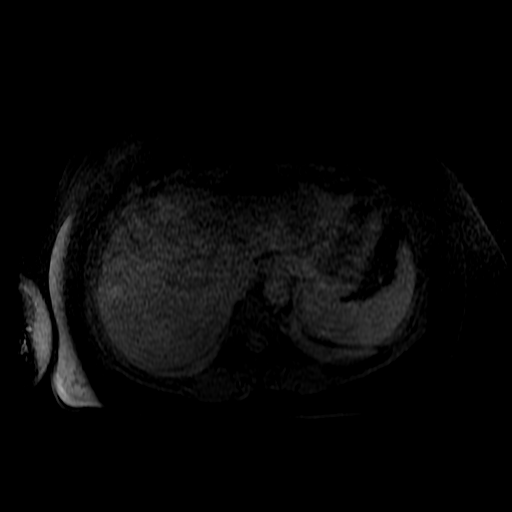

[Series 1402: T1 dynamic · axial · 5.0mm · 0.78mm/px · z∈[-30,+78]mm · 2 of 88 slices shown (3 of 3)]
[im 1/88]
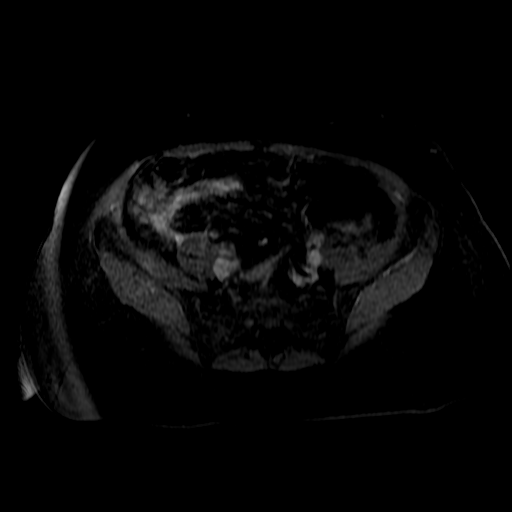
[im 44/88]
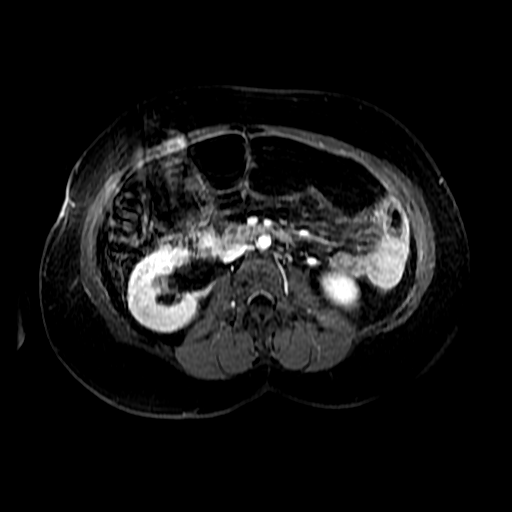

[23 of 48 positions shown; findings below may reference images not displayed]

FINDINGS: Motion degraded images.

Lower chest: Mild eventration of the right hemidiaphragm. Lung bases
are clear.

Hepatobiliary: Liver is within normal limits.  No hepatic steatosis.

Gallbladder is unremarkable. No intrahepatic or extrahepatic ductal
dilatation.

Pancreas: Within normal limits.

Spleen: Within normal limits.

Adrenals/Urinary Tract: Adrenal glands are within normal limits.

Scarring along the right upper kidney. 5 mm nonobstructing right
upper pole renal calculus (series 6/image 17), better visualized on
CT. Moderate right hydronephrosis.

10 mm left upper pole renal cyst (series 6/image 8). No left
hydronephrosis.

Stomach/Bowel: Stomach and visualized bowel are unremarkable.

Vascular/Lymphatic: No evidence abdominal aortic aneurysm.

No suspicious abdominal lymphadenopathy.

Other: No abdominal ascites.

6.6 x 13.0 x 13.0 cm fat density lesion in the left mid abdomen
(series 6/ image 27). No solid components/soft tissue elements. No
enhancement following contrast administration.

Musculoskeletal: No focal osseous lesions.
IMPRESSION: 6.6 x 13.0 x 13.0 cm fat density lesion in the left mid abdomen.

No solid components/soft tissue elements or enhancement on MRI. No
overt malignant features.

Differential considerations include lipoma (favored) and
well-differentiated liposarcoma (not entirely excluded).

While it is not possible to reliably distinguish between these two
entities on imaging, the lack of overt malignant features is
certainly reassuring.

## 2018-07-02 DIAGNOSIS — Z1231 Encounter for screening mammogram for malignant neoplasm of breast: Secondary | ICD-10-CM | POA: Diagnosis not present

## 2018-07-20 DIAGNOSIS — E785 Hyperlipidemia, unspecified: Secondary | ICD-10-CM | POA: Diagnosis not present

## 2018-07-20 DIAGNOSIS — Z23 Encounter for immunization: Secondary | ICD-10-CM | POA: Diagnosis not present

## 2019-01-11 DIAGNOSIS — L218 Other seborrheic dermatitis: Secondary | ICD-10-CM | POA: Diagnosis not present

## 2019-01-11 DIAGNOSIS — L814 Other melanin hyperpigmentation: Secondary | ICD-10-CM | POA: Diagnosis not present

## 2019-01-11 DIAGNOSIS — L570 Actinic keratosis: Secondary | ICD-10-CM | POA: Diagnosis not present

## 2019-01-11 DIAGNOSIS — L821 Other seborrheic keratosis: Secondary | ICD-10-CM | POA: Diagnosis not present

## 2019-04-16 DIAGNOSIS — L570 Actinic keratosis: Secondary | ICD-10-CM | POA: Diagnosis not present

## 2019-04-16 DIAGNOSIS — D485 Neoplasm of uncertain behavior of skin: Secondary | ICD-10-CM | POA: Diagnosis not present

## 2019-04-16 DIAGNOSIS — L821 Other seborrheic keratosis: Secondary | ICD-10-CM | POA: Diagnosis not present

## 2019-04-16 DIAGNOSIS — D2361 Other benign neoplasm of skin of right upper limb, including shoulder: Secondary | ICD-10-CM | POA: Diagnosis not present

## 2019-04-18 DIAGNOSIS — H2513 Age-related nuclear cataract, bilateral: Secondary | ICD-10-CM | POA: Diagnosis not present

## 2019-06-25 DIAGNOSIS — E785 Hyperlipidemia, unspecified: Secondary | ICD-10-CM | POA: Diagnosis not present

## 2019-06-25 DIAGNOSIS — I10 Essential (primary) hypertension: Secondary | ICD-10-CM | POA: Diagnosis not present

## 2019-06-25 DIAGNOSIS — E559 Vitamin D deficiency, unspecified: Secondary | ICD-10-CM | POA: Diagnosis not present

## 2019-07-29 DIAGNOSIS — Z01419 Encounter for gynecological examination (general) (routine) without abnormal findings: Secondary | ICD-10-CM | POA: Diagnosis not present

## 2019-07-29 DIAGNOSIS — Z1231 Encounter for screening mammogram for malignant neoplasm of breast: Secondary | ICD-10-CM | POA: Diagnosis not present

## 2019-07-30 ENCOUNTER — Other Ambulatory Visit: Payer: Self-pay | Admitting: Obstetrics & Gynecology

## 2019-07-30 DIAGNOSIS — E2839 Other primary ovarian failure: Secondary | ICD-10-CM

## 2019-11-25 DIAGNOSIS — E785 Hyperlipidemia, unspecified: Secondary | ICD-10-CM | POA: Diagnosis not present

## 2019-11-25 DIAGNOSIS — I10 Essential (primary) hypertension: Secondary | ICD-10-CM | POA: Diagnosis not present

## 2019-11-25 DIAGNOSIS — F324 Major depressive disorder, single episode, in partial remission: Secondary | ICD-10-CM | POA: Diagnosis not present

## 2019-11-25 DIAGNOSIS — E559 Vitamin D deficiency, unspecified: Secondary | ICD-10-CM | POA: Diagnosis not present

## 2020-06-09 DIAGNOSIS — M6530 Trigger finger, unspecified finger: Secondary | ICD-10-CM | POA: Diagnosis not present

## 2020-06-09 DIAGNOSIS — M25562 Pain in left knee: Secondary | ICD-10-CM | POA: Diagnosis not present

## 2020-06-18 DIAGNOSIS — M65331 Trigger finger, right middle finger: Secondary | ICD-10-CM | POA: Diagnosis not present

## 2020-06-18 DIAGNOSIS — M79642 Pain in left hand: Secondary | ICD-10-CM | POA: Diagnosis not present

## 2020-06-18 DIAGNOSIS — M19041 Primary osteoarthritis, right hand: Secondary | ICD-10-CM | POA: Diagnosis not present

## 2020-06-18 DIAGNOSIS — M720 Palmar fascial fibromatosis [Dupuytren]: Secondary | ICD-10-CM | POA: Diagnosis not present

## 2020-06-18 DIAGNOSIS — M79641 Pain in right hand: Secondary | ICD-10-CM | POA: Diagnosis not present

## 2020-06-18 DIAGNOSIS — M180 Bilateral primary osteoarthritis of first carpometacarpal joints: Secondary | ICD-10-CM | POA: Diagnosis not present

## 2020-06-18 DIAGNOSIS — G7080 Lambert-Eaton syndrome, unspecified: Secondary | ICD-10-CM | POA: Diagnosis not present

## 2020-06-18 DIAGNOSIS — M19042 Primary osteoarthritis, left hand: Secondary | ICD-10-CM | POA: Diagnosis not present

## 2020-06-23 DIAGNOSIS — M25562 Pain in left knee: Secondary | ICD-10-CM | POA: Diagnosis not present

## 2020-07-27 DIAGNOSIS — E785 Hyperlipidemia, unspecified: Secondary | ICD-10-CM | POA: Diagnosis not present

## 2020-07-27 DIAGNOSIS — I10 Essential (primary) hypertension: Secondary | ICD-10-CM | POA: Diagnosis not present

## 2020-07-27 DIAGNOSIS — Z0000 Encounter for general adult medical examination without abnormal findings: Secondary | ICD-10-CM | POA: Diagnosis not present

## 2020-07-27 DIAGNOSIS — Z23 Encounter for immunization: Secondary | ICD-10-CM | POA: Diagnosis not present

## 2020-07-27 DIAGNOSIS — E559 Vitamin D deficiency, unspecified: Secondary | ICD-10-CM | POA: Diagnosis not present

## 2020-08-03 DIAGNOSIS — M720 Palmar fascial fibromatosis [Dupuytren]: Secondary | ICD-10-CM | POA: Diagnosis not present

## 2020-08-03 DIAGNOSIS — M65331 Trigger finger, right middle finger: Secondary | ICD-10-CM | POA: Diagnosis not present

## 2020-08-03 DIAGNOSIS — M180 Bilateral primary osteoarthritis of first carpometacarpal joints: Secondary | ICD-10-CM | POA: Diagnosis not present

## 2020-08-06 DIAGNOSIS — Z1231 Encounter for screening mammogram for malignant neoplasm of breast: Secondary | ICD-10-CM | POA: Diagnosis not present

## 2020-08-06 DIAGNOSIS — Z01419 Encounter for gynecological examination (general) (routine) without abnormal findings: Secondary | ICD-10-CM | POA: Diagnosis not present

## 2020-08-06 DIAGNOSIS — Z6830 Body mass index (BMI) 30.0-30.9, adult: Secondary | ICD-10-CM | POA: Diagnosis not present

## 2020-08-19 ENCOUNTER — Other Ambulatory Visit: Payer: Self-pay | Admitting: Obstetrics & Gynecology

## 2020-08-19 DIAGNOSIS — E2839 Other primary ovarian failure: Secondary | ICD-10-CM

## 2020-09-14 DIAGNOSIS — M65331 Trigger finger, right middle finger: Secondary | ICD-10-CM | POA: Diagnosis not present

## 2020-09-16 ENCOUNTER — Other Ambulatory Visit: Payer: Self-pay | Admitting: Orthopedic Surgery

## 2020-10-05 ENCOUNTER — Encounter (HOSPITAL_BASED_OUTPATIENT_CLINIC_OR_DEPARTMENT_OTHER): Payer: Self-pay | Admitting: Orthopedic Surgery

## 2020-10-05 ENCOUNTER — Other Ambulatory Visit: Payer: Self-pay

## 2020-10-13 ENCOUNTER — Other Ambulatory Visit (HOSPITAL_COMMUNITY)
Admission: RE | Admit: 2020-10-13 | Discharge: 2020-10-13 | Disposition: A | Discharge status: Home / Self Care | Payer: BC Managed Care – PPO | Source: Ambulatory Visit | Attending: Orthopedic Surgery | Admitting: Orthopedic Surgery

## 2020-10-13 ENCOUNTER — Encounter (HOSPITAL_BASED_OUTPATIENT_CLINIC_OR_DEPARTMENT_OTHER)
Admission: RE | Admit: 2020-10-13 | Discharge: 2020-10-13 | Disposition: A | Discharge status: Home / Self Care | Payer: BC Managed Care – PPO | Source: Ambulatory Visit | Attending: Orthopedic Surgery | Admitting: Orthopedic Surgery

## 2020-10-13 DIAGNOSIS — Z01810 Encounter for preprocedural cardiovascular examination: Secondary | ICD-10-CM | POA: Insufficient documentation

## 2020-10-13 DIAGNOSIS — Z01812 Encounter for preprocedural laboratory examination: Secondary | ICD-10-CM | POA: Insufficient documentation

## 2020-10-13 DIAGNOSIS — Z87891 Personal history of nicotine dependence: Secondary | ICD-10-CM | POA: Diagnosis not present

## 2020-10-13 DIAGNOSIS — M65331 Trigger finger, right middle finger: Secondary | ICD-10-CM | POA: Diagnosis not present

## 2020-10-13 DIAGNOSIS — Z20822 Contact with and (suspected) exposure to covid-19: Secondary | ICD-10-CM | POA: Insufficient documentation

## 2020-10-13 DIAGNOSIS — Z880 Allergy status to penicillin: Secondary | ICD-10-CM | POA: Diagnosis not present

## 2020-10-13 LAB — SARS CORONAVIRUS 2 (TAT 6-24 HRS): SARS Coronavirus 2: NEGATIVE

## 2020-10-13 NOTE — Progress Notes (Signed)

## 2020-10-14 NOTE — Anesthesia Preprocedure Evaluation (Addendum)
Anesthesia Evaluation  Patient identified by MRN, date of birth, ID band Patient awake    Reviewed: Allergy & Precautions, NPO status , Patient's Chart, lab work & pertinent test results  History of Anesthesia Complications (+) PONV  Airway Mallampati: II  TM Distance: >3 FB Neck ROM: Full    Dental no notable dental hx. (+) Teeth Intact, Dental Advisory Given   Pulmonary former smoker,    Pulmonary exam normal breath sounds clear to auscultation       Cardiovascular hypertension, Pt. on medications Normal cardiovascular exam Rhythm:Regular Rate:Normal     Neuro/Psych Depression  Neuromuscular disease    GI/Hepatic PUD,   Endo/Other  negative endocrine ROS  Renal/GU Renal disease     Musculoskeletal  (+) Arthritis ,   Abdominal   Peds  Hematology  (+) anemia ,   Anesthesia Other Findings   Reproductive/Obstetrics                            Anesthesia Physical Anesthesia Plan  ASA: III  Anesthesia Plan: Bier Block and Bier Block-LIDOCAINE ONLY   Post-op Pain Management:    Induction:   PONV Risk Score and Plan: 3 and Treatment may vary due to age or medical condition, Ondansetron and Midazolam  Airway Management Planned: Nasal Cannula and Natural Airway  Additional Equipment: None  Intra-op Plan:   Post-operative Plan:   Informed Consent: I have reviewed the patients History and Physical, chart, labs and discussed the procedure including the risks, benefits and alternatives for the proposed anesthesia with the patient or authorized representative who has indicated his/her understanding and acceptance.     Dental advisory given  Plan Discussed with: CRNA and Anesthesiologist  Anesthesia Plan Comments:        Anesthesia Quick Evaluation

## 2020-10-15 ENCOUNTER — Other Ambulatory Visit: Payer: Self-pay

## 2020-10-15 ENCOUNTER — Encounter (HOSPITAL_BASED_OUTPATIENT_CLINIC_OR_DEPARTMENT_OTHER): Payer: Self-pay | Admitting: Orthopedic Surgery

## 2020-10-15 ENCOUNTER — Ambulatory Visit (HOSPITAL_BASED_OUTPATIENT_CLINIC_OR_DEPARTMENT_OTHER)
Admission: RE | Admit: 2020-10-15 | Discharge: 2020-10-15 | Disposition: A | Discharge status: Home / Self Care | Payer: BC Managed Care – PPO | Attending: Orthopedic Surgery | Admitting: Orthopedic Surgery

## 2020-10-15 ENCOUNTER — Encounter (HOSPITAL_BASED_OUTPATIENT_CLINIC_OR_DEPARTMENT_OTHER)
Admission: RE | Disposition: A | Discharge status: Home / Self Care | Payer: Self-pay | Source: Home / Self Care | Attending: Orthopedic Surgery

## 2020-10-15 ENCOUNTER — Ambulatory Visit (HOSPITAL_BASED_OUTPATIENT_CLINIC_OR_DEPARTMENT_OTHER): Payer: BC Managed Care – PPO | Admitting: Certified Registered Nurse Anesthetist

## 2020-10-15 DIAGNOSIS — Z87891 Personal history of nicotine dependence: Secondary | ICD-10-CM | POA: Diagnosis not present

## 2020-10-15 DIAGNOSIS — M65331 Trigger finger, right middle finger: Secondary | ICD-10-CM | POA: Insufficient documentation

## 2020-10-15 DIAGNOSIS — D649 Anemia, unspecified: Secondary | ICD-10-CM | POA: Diagnosis not present

## 2020-10-15 DIAGNOSIS — Z880 Allergy status to penicillin: Secondary | ICD-10-CM | POA: Diagnosis not present

## 2020-10-15 DIAGNOSIS — Z20822 Contact with and (suspected) exposure to covid-19: Secondary | ICD-10-CM | POA: Insufficient documentation

## 2020-10-15 DIAGNOSIS — I10 Essential (primary) hypertension: Secondary | ICD-10-CM | POA: Diagnosis not present

## 2020-10-15 DIAGNOSIS — E785 Hyperlipidemia, unspecified: Secondary | ICD-10-CM | POA: Diagnosis not present

## 2020-10-15 HISTORY — PX: TRIGGER FINGER RELEASE: SHX641

## 2020-10-15 SURGERY — RELEASE, A1 PULLEY, FOR TRIGGER FINGER
Anesthesia: Monitor Anesthesia Care | Site: Hand | Laterality: Right

## 2020-10-15 MED ORDER — GLYCOPYRROLATE PF 0.2 MG/ML IJ SOSY
PREFILLED_SYRINGE | INTRAMUSCULAR | Status: AC
  Filled 2020-10-15: qty 1

## 2020-10-15 MED ORDER — ONDANSETRON HCL 4 MG/2ML IJ SOLN
INTRAMUSCULAR | Status: AC
  Filled 2020-10-15: qty 2

## 2020-10-15 MED ORDER — BUPIVACAINE HCL (PF) 0.25 % IJ SOLN
INTRAMUSCULAR | Status: DC | PRN
  Administered 2020-10-15: 6 mL

## 2020-10-15 MED ORDER — LACTATED RINGERS IV SOLN: INTRAVENOUS | Status: DC

## 2020-10-15 MED ORDER — GLYCOPYRROLATE 0.2 MG/ML IJ SOLN
INTRAMUSCULAR | Status: DC | PRN
  Administered 2020-10-15: .2 mg via INTRAVENOUS

## 2020-10-15 MED ORDER — MIDAZOLAM HCL 2 MG/2ML IJ SOLN
INTRAMUSCULAR | Status: AC
  Filled 2020-10-15: qty 2

## 2020-10-15 MED ORDER — PROPOFOL 500 MG/50ML IV EMUL
INTRAVENOUS | Status: DC | PRN
  Administered 2020-10-15: 25 ug/kg/min via INTRAVENOUS

## 2020-10-15 MED ORDER — CEFAZOLIN SODIUM-DEXTROSE 2-4 GM/100ML-% IV SOLN
2.0000 g | INTRAVENOUS | Status: AC
  Administered 2020-10-15: 2 g via INTRAVENOUS

## 2020-10-15 MED ORDER — LIDOCAINE 2% (20 MG/ML) 5 ML SYRINGE
INTRAMUSCULAR | Status: AC
  Filled 2020-10-15: qty 5

## 2020-10-15 MED ORDER — PROPOFOL 500 MG/50ML IV EMUL
INTRAVENOUS | Status: AC
  Filled 2020-10-15: qty 50

## 2020-10-15 MED ORDER — OXYCODONE HCL 5 MG/5ML PO SOLN: 5.0000 mg | Freq: Once | ORAL | Status: DC | PRN

## 2020-10-15 MED ORDER — FENTANYL CITRATE (PF) 100 MCG/2ML IJ SOLN: 25.0000 ug | INTRAMUSCULAR | Status: DC | PRN

## 2020-10-15 MED ORDER — ONDANSETRON HCL 4 MG/2ML IJ SOLN
INTRAMUSCULAR | Status: DC | PRN
  Administered 2020-10-15: 4 mg via INTRAVENOUS

## 2020-10-15 MED ORDER — OXYCODONE HCL 5 MG PO TABS: 5.0000 mg | ORAL_TABLET | Freq: Once | ORAL | Status: DC | PRN

## 2020-10-15 MED ORDER — ONDANSETRON HCL 4 MG/2ML IJ SOLN
4.0000 mg | Freq: Once | INTRAMUSCULAR | Status: AC | PRN
  Administered 2020-10-15: 4 mg via INTRAVENOUS

## 2020-10-15 MED ORDER — DEXAMETHASONE SODIUM PHOSPHATE 10 MG/ML IJ SOLN
INTRAMUSCULAR | Status: AC
  Filled 2020-10-15: qty 1

## 2020-10-15 MED ORDER — FENTANYL CITRATE (PF) 100 MCG/2ML IJ SOLN
INTRAMUSCULAR | Status: DC | PRN
  Administered 2020-10-15 (×4): 25 ug via INTRAVENOUS

## 2020-10-15 MED ORDER — BUPIVACAINE HCL (PF) 0.25 % IJ SOLN
INTRAMUSCULAR | Status: AC
  Filled 2020-10-15: qty 30

## 2020-10-15 MED ORDER — FENTANYL CITRATE (PF) 100 MCG/2ML IJ SOLN
INTRAMUSCULAR | Status: AC
  Filled 2020-10-15: qty 2

## 2020-10-15 MED ORDER — CEFAZOLIN SODIUM-DEXTROSE 2-4 GM/100ML-% IV SOLN
INTRAVENOUS | Status: AC
  Filled 2020-10-15: qty 100

## 2020-10-15 MED ORDER — MIDAZOLAM HCL 2 MG/2ML IJ SOLN
INTRAMUSCULAR | Status: DC | PRN
  Administered 2020-10-15: 2 mg via INTRAVENOUS

## 2020-10-15 MED ORDER — PROPOFOL 10 MG/ML IV BOLUS
INTRAVENOUS | Status: DC | PRN
  Administered 2020-10-15: 20 mg via INTRAVENOUS

## 2020-10-15 MED ORDER — TRAMADOL HCL 50 MG PO TABS: 50.0000 mg | ORAL_TABLET | Freq: Four times a day (QID) | ORAL | 0 refills | Status: DC | PRN

## 2020-10-15 MED ORDER — LIDOCAINE HCL (CARDIAC) PF 100 MG/5ML IV SOSY
PREFILLED_SYRINGE | INTRAVENOUS | Status: DC | PRN
  Administered 2020-10-15: 50 mg via INTRATRACHEAL

## 2020-10-15 MED ORDER — DEXAMETHASONE SODIUM PHOSPHATE 10 MG/ML IJ SOLN
INTRAMUSCULAR | Status: DC | PRN
  Administered 2020-10-15: 5 mg via INTRAVENOUS

## 2020-10-15 SURGICAL SUPPLY — 32 items
APL PRP STRL LF DISP 70% ISPRP (MISCELLANEOUS) ×1
BLADE SURG 15 STRL LF DISP TIS (BLADE) ×1 IMPLANT
BLADE SURG 15 STRL SS (BLADE) ×2
BNDG CMPR 9X4 STRL LF SNTH (GAUZE/BANDAGES/DRESSINGS)
BNDG COHESIVE 2X5 TAN STRL LF (GAUZE/BANDAGES/DRESSINGS) ×2 IMPLANT
BNDG ESMARK 4X9 LF (GAUZE/BANDAGES/DRESSINGS) IMPLANT
CHLORAPREP W/TINT 26 (MISCELLANEOUS) ×2 IMPLANT
CORD BIPOLAR FORCEPS 12FT (ELECTRODE) IMPLANT
COVER BACK TABLE 60X90IN (DRAPES) ×2 IMPLANT
COVER MAYO STAND STRL (DRAPES) ×2 IMPLANT
COVER WAND RF STERILE (DRAPES) IMPLANT
CUFF TOURN SGL QUICK 18X4 (TOURNIQUET CUFF) IMPLANT
DECANTER SPIKE VIAL GLASS SM (MISCELLANEOUS) IMPLANT
DRAPE EXTREMITY T 121X128X90 (DISPOSABLE) ×2 IMPLANT
DRAPE SURG 17X23 STRL (DRAPES) ×2 IMPLANT
GAUZE SPONGE 4X4 12PLY STRL (GAUZE/BANDAGES/DRESSINGS) ×2 IMPLANT
GAUZE XEROFORM 1X8 LF (GAUZE/BANDAGES/DRESSINGS) ×2 IMPLANT
GLOVE BIOGEL PI IND STRL 8.5 (GLOVE) ×1 IMPLANT
GLOVE BIOGEL PI INDICATOR 8.5 (GLOVE) ×1
GLOVE SURG ORTHO 8.0 STRL STRW (GLOVE) ×2 IMPLANT
GOWN STRL REUS W/ TWL LRG LVL3 (GOWN DISPOSABLE) ×1 IMPLANT
GOWN STRL REUS W/TWL LRG LVL3 (GOWN DISPOSABLE) ×2
GOWN STRL REUS W/TWL XL LVL3 (GOWN DISPOSABLE) ×2 IMPLANT
NEEDLE PRECISIONGLIDE 27X1.5 (NEEDLE) ×2 IMPLANT
NS IRRIG 1000ML POUR BTL (IV SOLUTION) ×2 IMPLANT
PACK BASIN DAY SURGERY FS (CUSTOM PROCEDURE TRAY) ×2 IMPLANT
STOCKINETTE 4X48 STRL (DRAPES) ×2 IMPLANT
SUT ETHILON 4 0 PS 2 18 (SUTURE) ×2 IMPLANT
SYR BULB EAR ULCER 3OZ GRN STR (SYRINGE) ×2 IMPLANT
SYR CONTROL 10ML LL (SYRINGE) ×2 IMPLANT
TOWEL GREEN STERILE FF (TOWEL DISPOSABLE) ×4 IMPLANT
UNDERPAD 30X36 HEAVY ABSORB (UNDERPADS AND DIAPERS) ×2 IMPLANT

## 2020-10-15 NOTE — H&P (Signed)
Leslie Harrison is an 64 y.o. female.   Chief Complaint:catching right middle finger  SAY:TKZSW is a 64 yo female with triggering of her right middle finger. This began 2 years ago. She has had 2 injections without relief. She has no history of diabetes.  Past Medical History:  Diagnosis Date  . Anemia   . Arthritis    hands  . Borderline hypertension   . Carpal tunnel syndrome, bilateral    R > L  . Depression   . Hyperlipidemia   . Hypertension   . Kidney disease, chronic, stage II (mild, EGFR 60+ ml/min)   . Osteopenia   . PONV (postoperative nausea and vomiting)   . Renal calculus, right   . Right ureteral stone   . Trigger finger, right middle finger   . Ulcerative colitis ([place])   . Urgency of urination   . Wears glasses   . Wears partial dentures    lower    Past Surgical History:  Procedure Laterality Date  . COLOSTOMY TAKEDOWN  2005  . CYSTOSCOPY WITH RETROGRADE PYELOGRAM, URETEROSCOPY AND STENT PLACEMENT Right 06/10/2016   Procedure: CYSTOSCOPY WITH RETROGRADE PYELOGRAM, URETEROSCOPY AND STENT PLACEMENT;  Surgeon: Alexis Frock, MD;  Location: [facility];  Service: Urology;  Laterality: Right;  [place] [id number]  . HOLMIUM LASER APPLICATION Right 0/25/4270   Procedure: HOLMIUM LASER APPLICATION;  Surgeon: Alexis Frock, MD;  Location: [facility];  Service: Urology;  Laterality: Right;  . LAPAROTOMY N/A 05/12/2016   Procedure: EXPLORATORY LAPAROTOMY  AND  OMENTECTOMY;  Surgeon: Armandina Gemma, MD;  Location: [facility];  Service: General;  Laterality: N/A;  . STONE EXTRACTION WITH BASKET Right 06/10/2016   Procedure: STONE EXTRACTION WITH BASKET;  Surgeon: Alexis Frock, MD;  Location: [facility];  Service: Urology;  Laterality: Right;  . SUBTOTAL COLECTOMY W/ ILEOSTOMY  2004   in [place]  . TUBAL LIGATION  1978    Family History  Problem Relation Age of Onset  . Rectal  cancer Father   . Ulcerative colitis Father   . Cervical cancer Maternal Grandmother    Social History:  reports that she quit smoking about 34 years ago. She quit after 10.00 years of use. She has never used smokeless tobacco. She reports that she does not drink alcohol and does not use drugs.  Allergies:  Allergies  Allergen Reactions  . Penicillin Potassium-G [Penicillin G]     Hives     No medications prior to admission.    Results for orders placed or performed during the hospital encounter of 10/13/20 (from the past 48 hour(s))  SARS CORONAVIRUS 2 (TAT 6-24 HRS) Nasopharyngeal Nasopharyngeal Swab     Status: None   Collection Time: 10/13/20  8:35 AM   Specimen: Nasopharyngeal Swab  Result Value Ref Range   SARS Coronavirus 2 NEGATIVE NEGATIVE    Comment: (NOTE) SARS-CoV-2 target nucleic acids are NOT DETECTED.  The SARS-CoV-2 RNA is generally detectable in upper and lower respiratory specimens during the acute phase of infection. Negative results do not preclude SARS-CoV-2 infection, do not rule out co-infections with other pathogens, and should not be used as the sole basis for treatment or other patient management decisions. Negative results must be combined with clinical observations, patient history, and epidemiological information. The expected result is Negative.  Fact Sheet for Patients: SugarRoll.be  Fact Sheet for Healthcare Providers: https://www.woods-mathews.com/  This test is not yet approved or cleared by  the [employer] and  has been authorized for detection and/or diagnosis of SARS-CoV-2 by FDA under an Emergency Use Authorization (EUA). This EUA will remain  in effect (meaning this test can be used) for the duration of the COVID-19 declaration under Se ction 564(b)(1) of the Act, 21 U.S.C. section 360bbb-3(b)(1), unless the authorization is terminated or revoked sooner.  Performed at [facility], [place] [address], [place], [place] [address]     No results found.   Pertinent items are noted in HPI.  Height 4' 11" (1.499 m), weight 68 kg.  General appearance: alert, cooperative and appears stated age Head: Normocephalic, without obvious abnormality Neck: no JVD Resp: clear to auscultation bilaterally Cardio: regular rate and rhythm, S1, S2 normal, no murmur, click, rub or gallop GI: soft, non-tender; bowel sounds normal; no masses,  no organomegaly Extremities: catching right middle finger Pulses: 2+ and symmetric Skin: Skin color, texture, turgor normal. No rashes or lesions Neurologic: Grossly normal Incision/Wound: na  Assessment/Plan Assessment:  1. Trigger finger, right middle finger    Plan: We have discussed with her surgical release of the flexor tendons at the A1 pulley. Preperi-and postoperative course been discussed along with risks and complications. She is aware that there is no guarantee to the surgery the possibility of infection recurrence injury to arteries nerves tendons complete relief of symptoms and dystrophy. She would like to proceed to have this done. She is scheduled for A1 pulley release right middle finger as an outpatient under regional anesthesia.     Daryll Brod 10/15/2020, 4:47 AM

## 2020-10-15 NOTE — Brief Op Note (Signed)
10/15/2020  9:02 AM  PATIENT:  Leslie Harrison  64 y.o. female  PRE-OPERATIVE DIAGNOSIS:  RIGHT MIDDLE TRIGGER FINGER  POST-OPERATIVE DIAGNOSIS:  RIGHT MIDDLE TRIGGER FINGER  PROCEDURE:  Procedure(s) with comments: RELEASE TRIGGER FINGER/A-1 PULLEY RIGHT MIDDLE FINGER (Right) - IV REGIONAL FOREARM BLOCK  SURGEON:  Surgeon(s) and Role:    * Cindee Salt, MD - Primary  PHYSICIAN ASSISTANT:   ASSISTANTS: none   ANESTHESIA:   local, regional and IV sedation  EBL:  65ml  BLOOD ADMINISTERED:none  DRAINS: none   LOCAL MEDICATIONS USED:  BUPIVICAINE   SPECIMEN:  No Specimen  DISPOSITION OF SPECIMEN:  N/A  COUNTS:  YES  TOURNIQUET:   Total Tourniquet Time Documented: Forearm (Right) - 16 minutes Total: Forearm (Right) - 16 minutes   DICTATION: .Dragon Dictation  PLAN OF CARE: Discharge to home after PACU  PATIENT DISPOSITION:  PACU - hemodynamically stable.

## 2020-10-15 NOTE — Transfer of Care (Signed)
Immediate Anesthesia Transfer of Care Note  Patient: Leslie Harrison  Procedure(s) Performed: RELEASE TRIGGER FINGER/A-1 PULLEY RIGHT MIDDLE FINGER (Right Hand)  Patient Location: PACU  Anesthesia Type:MAC and Bier block  Level of Consciousness: awake, oriented and patient cooperative  Airway & Oxygen Therapy: Patient Spontanous Breathing and Patient connected to face mask  Post-op Assessment: Report given to RN, Post -op Vital signs reviewed and stable, Patient moving all extremities X 4 and Patient able to stick tongue midline  Post vital signs: Reviewed and stable  Last Vitals:  Vitals Value Taken Time  BP    Temp    Pulse    Resp    SpO2      Last Pain:  Vitals:   10/15/20 0725  TempSrc: Oral  PainSc: 0-No pain      Patients Stated Pain Goal: 4 (37/00/52 5910)  Complications: No complications documented.

## 2020-10-15 NOTE — Discharge Instructions (Addendum)

## 2020-10-15 NOTE — Anesthesia Postprocedure Evaluation (Signed)
Anesthesia Post Note  Patient: Leslie Harrison  Procedure(s) Performed: RELEASE TRIGGER FINGER/A-1 PULLEY RIGHT MIDDLE FINGER (Right Hand)     Patient location during evaluation: PACU Anesthesia Type: MAC and Bier Block Level of consciousness: awake and alert Pain management: pain level controlled Vital Signs Assessment: post-procedure vital signs reviewed and stable Respiratory status: spontaneous breathing, nonlabored ventilation, respiratory function stable and patient connected to nasal cannula oxygen Cardiovascular status: stable and blood pressure returned to baseline Postop Assessment: no apparent nausea or vomiting Anesthetic complications: no   No complications documented.  Last Vitals:  Vitals:   10/15/20 0930 10/15/20 0941  BP: (!) 149/72 (!) 149/69  Pulse: 80 80  Resp: 13 16  Temp:  36.5 C  SpO2: 93% 96%    Last Pain:  Vitals:   10/15/20 0941  TempSrc:   PainSc: 0-No pain                 Barnet Glasgow

## 2020-10-15 NOTE — Op Note (Signed)
NAME: Leslie Harrison MEDICAL RECORD NO: [id number] DATE OF BIRTH: 1955-10-21 FACILITY: [facility] LOCATION: [facility] PHYSICIAN: Nicki Reaper, MD   OPERATIVE REPORT   DATE OF PROCEDURE: 10/15/20    PREOPERATIVE DIAGNOSIS:   Stenosing tenosynovitis right middle   POSTOPERATIVE DIAGNOSIS:   Same   PROCEDURE:   Release A1 pulley right middle   SURGEON: Cindee Salt, M.D.   ASSISTANT: none   ANESTHESIA:  Bier block with sedation plus local   INTRAVENOUS FLUIDS:  Per anesthesia flow sheet.   ESTIMATED BLOOD LOSS:  Minimal.   COMPLICATIONS:  None.   SPECIMENS:  none   TOURNIQUET TIME:    Total Tourniquet Time Documented: Forearm (Right) - 16 minutes Total: Forearm (Right) - 16 minutes    DISPOSITION:  Stable to PACU.   INDICATIONS: Patient is a 64 year old female with a history of catching of her right middle finger.  This not responded to conservative treatment: Including multiple injections.  She has elected undergo release of the A1 pulley.  Pre-peripostoperative course been discussed along with risks and complications.  She is aware there is no guarantee to the surgery the possibility of infection recurrence injury to arteries nerves tendons complete relief of symptoms and dystrophy.  Preoperative.  Patient is seen the extremity marked by both patient and surgeon antibiotic  OPERATIVE COURSE: Patient is brought to the operating room placed in the supine position.  A forearm IV regional anesthetic was carried out without difficulty under the direction of the anesthesia department.  She was prepped using ChloraPrep.  3-minute dry time was allowed timeout taken to confirm patient procedure.  Oblique incision was made over the metacarpal phalangeal joint of her right middle finger carried down through subcutaneous tissue.  Neurovascular structures were identified protected retractors the A1 pulley was identified this was released on its radial aspect a small incision  made centrally and A2.  Tenosynovitis which proximally was separated with a blunt dissection.  The 2 tendons were then separated using retractors breaking any adhesions between the 2 tendons.  The pin was placed through full range of motion no further triggering was noted.  The wound was copious irrigated with saline.  The skin was closed interrupted 4-0 nylon sutures.  Local infiltration quarter percent bupivacaine without epinephrine was given.  Approximately 5 cc was used.  Sterile compressive dressing with fingers 3 was applied.  Inflation of the tourniquet all fingers immediately pink.  She was able to fully flex and extend her finger without any further triggering.  She was taken to the recovery room for observation in satisfactory condition.  She will be discharged home return to the hand center of [facility] in 1 week Tylenol ibuprofen for pain with Ultram for breakthrough.   Cindee Salt, MD Electronically signed, 10/15/20

## 2020-10-19 ENCOUNTER — Encounter (HOSPITAL_BASED_OUTPATIENT_CLINIC_OR_DEPARTMENT_OTHER): Payer: Self-pay | Admitting: Orthopedic Surgery

## 2021-01-08 ENCOUNTER — Other Ambulatory Visit: Payer: 59

## 2021-02-18 ENCOUNTER — Other Ambulatory Visit: Payer: BC Managed Care – PPO

## 2021-07-29 ENCOUNTER — Other Ambulatory Visit: Payer: BC Managed Care – PPO

## 2021-09-07 DIAGNOSIS — E559 Vitamin D deficiency, unspecified: Secondary | ICD-10-CM | POA: Diagnosis not present

## 2021-09-07 DIAGNOSIS — N183 Chronic kidney disease, stage 3 unspecified: Secondary | ICD-10-CM | POA: Diagnosis not present

## 2021-09-07 DIAGNOSIS — M8580 Other specified disorders of bone density and structure, unspecified site: Secondary | ICD-10-CM | POA: Diagnosis not present

## 2021-09-07 DIAGNOSIS — E785 Hyperlipidemia, unspecified: Secondary | ICD-10-CM | POA: Diagnosis not present

## 2021-09-07 DIAGNOSIS — I10 Essential (primary) hypertension: Secondary | ICD-10-CM | POA: Diagnosis not present

## 2021-09-07 DIAGNOSIS — Z0000 Encounter for general adult medical examination without abnormal findings: Secondary | ICD-10-CM | POA: Diagnosis not present

## 2021-09-07 DIAGNOSIS — Z23 Encounter for immunization: Secondary | ICD-10-CM | POA: Diagnosis not present

## 2021-09-07 DIAGNOSIS — Z1159 Encounter for screening for other viral diseases: Secondary | ICD-10-CM | POA: Diagnosis not present

## 2021-09-17 DIAGNOSIS — Z1231 Encounter for screening mammogram for malignant neoplasm of breast: Secondary | ICD-10-CM | POA: Diagnosis not present

## 2022-02-16 DIAGNOSIS — D485 Neoplasm of uncertain behavior of skin: Secondary | ICD-10-CM | POA: Diagnosis not present

## 2022-02-16 DIAGNOSIS — L821 Other seborrheic keratosis: Secondary | ICD-10-CM | POA: Diagnosis not present

## 2022-02-16 DIAGNOSIS — L820 Inflamed seborrheic keratosis: Secondary | ICD-10-CM | POA: Diagnosis not present

## 2022-09-13 DIAGNOSIS — Z9049 Acquired absence of other specified parts of digestive tract: Secondary | ICD-10-CM | POA: Diagnosis not present

## 2022-09-13 DIAGNOSIS — Z0000 Encounter for general adult medical examination without abnormal findings: Secondary | ICD-10-CM | POA: Diagnosis not present

## 2022-09-13 DIAGNOSIS — E559 Vitamin D deficiency, unspecified: Secondary | ICD-10-CM | POA: Diagnosis not present

## 2022-09-13 DIAGNOSIS — I10 Essential (primary) hypertension: Secondary | ICD-10-CM | POA: Diagnosis not present

## 2022-09-13 DIAGNOSIS — R0789 Other chest pain: Secondary | ICD-10-CM | POA: Diagnosis not present

## 2022-09-13 DIAGNOSIS — E785 Hyperlipidemia, unspecified: Secondary | ICD-10-CM | POA: Diagnosis not present

## 2022-09-13 DIAGNOSIS — M25511 Pain in right shoulder: Secondary | ICD-10-CM | POA: Diagnosis not present

## 2022-09-13 DIAGNOSIS — Z23 Encounter for immunization: Secondary | ICD-10-CM | POA: Diagnosis not present

## 2022-09-23 ENCOUNTER — Encounter: Payer: Self-pay | Admitting: Cardiology

## 2022-09-23 ENCOUNTER — Ambulatory Visit: Payer: Medicare Other | Attending: Cardiology | Admitting: Cardiology

## 2022-09-23 VITALS — BP 138/70 | HR 66 | Ht 59.0 in | Wt 159.2 lb

## 2022-09-23 DIAGNOSIS — E782 Mixed hyperlipidemia: Secondary | ICD-10-CM

## 2022-09-23 DIAGNOSIS — I10 Essential (primary) hypertension: Secondary | ICD-10-CM

## 2022-09-23 DIAGNOSIS — R072 Precordial pain: Secondary | ICD-10-CM | POA: Insufficient documentation

## 2022-09-23 NOTE — Assessment & Plan Note (Signed)
Borderline blood pressure today.  Apparently was going to increase her dose of ARB.  Defer monitoring to PCP for now.

## 2022-09-23 NOTE — Progress Notes (Signed)
ATTENDING ATTESTATION  I have seen, examined and evaluated the patient along with the Resident Physician Luna Fuse Dameron, DO) in clinic today.  I personally performed my own interview & exanimation.  After reviewing all the available data and chart, we discussed the patients laboratory, study & physical findings as well as symptoms in detail. I agree with her findings, examination as well as impression recommendations as per our discussion.    Attending adjustments int the full clinic noted annotated in [place].   Patient presented at the request of her PCP for evaluation of point tenderness chest pain.  Also has significant cardiac risk factors including sister with recent diagnosis of MI.  Agree with the HPI and ROS as noted by resident.  Personally reviewed the labs from PCPs office.  General appearance: alert, cooperative, appears stated age, no distress, and mildly obese Neck: no adenopathy, no carotid bruit, no JVD, supple, symmetrical, trachea midline, and thyroid not enlarged, symmetric, no tenderness/mass/nodules Lungs: clear to auscultation bilaterally, normal percussion bilaterally, and no M/R/G.  Nonlabored. Heart: regular rate and rhythm, S1, S2 normal, no murmur, click, rub or gallop and normal apical impulse Abdomen: soft, non-tender; bowel sounds normal; no masses,  no organomegaly Extremities: extremities normal, atraumatic, no cyanosis or edema Pulses: 2+ and symmetric Neurologic: Grossly normal Point tenderness along the right costosternal border roughly third rib space.  This reproduces her pain.   Precordial chest pain Patient is here with a 69-monthhistory of chest tightness mostly at night and is positional in nature.  Most likely musculoskeletal in nature given tenderness to palpation over sternal costal joint on exam.  Much less likely to be coronary spasm as she does not have any shortness of breath or other concerning symptoms. Although, does have risk factors of  hypertension and hyperlipidemia with LDL not at goal (E LDL 190) and family history of sister with MI.  Plan: Will risk stratify with CT cardiac coronary calcium scoring.  Depending on the score, can consider stress test as well. Obtain LP(a) as well for additional risk stratification.  Reproducible chest pain on exam which seems to be similar to what she is noting.  She has not had any of the symptoms with exertion and that certainly has not been exacerbated with exertion.  Usually occur when at night when she is laying down, often associated with rolling over.  Sounds very musculoskeletal in nature.  I have asked that she monitor her for symptoms over the next several months and we can reassess that we have performed Coronary Calcium Score.  Based on the severity of Coronary Calcium Score we may or may not want to consider further evaluation with either simple test such as GXT or potentially more definitive evaluation with Coronary CTA if she is still having symptoms.  Plan: Check Coronary Calcium Score along with LP(a) for risk stratification.  Essential hypertension Borderline blood pressure today.  Apparently was going to increase her dose of ARB.  Defer monitoring to PCP for now.  HLD (hyperlipidemia) Hyperlipidemia.  She has a family history of CAD now with her sister along with hypertension herself.  Would like to try to risk stratify a little bit more to better identify how well we want to control her lipids.  Pravastatin dose was recently increased because of pretty significant elevated LDL.  Target LDL for her with her family history of CAD should be at least less than 100.  Not sure from statin when it is there. Coronary Calcium Scoring Will Help  Stratify.  We will also check LP(a) given the fact that her sister just had a heart attack Make sure that she does not have a risk for progressive CAD, that is not adequately treated by statins.      Will plan follow-up after Coronary Calcium  Score in roughly 2 months to reassess symptoms and discuss for results.     Leslie Man, MD, MS Leslie Harrison, M.D., M.S. Interventional Cardiologist  [facility]  Pager # [phone] Phone # [phone] [address] [place], [place] [address]

## 2022-09-23 NOTE — Assessment & Plan Note (Addendum)
Patient is here with a 10-monthhistory of chest tightness mostly at night and is positional in nature.  Most likely musculoskeletal in nature given tenderness to palpation over sternal costal joint on exam.  Much less likely to be coronary spasm as she does not have any shortness of breath or other concerning symptoms. Although, does have risk factors of hypertension and hyperlipidemia with LDL not at goal (E LDL 190) and family history of sister with MI.  Plan: Will risk stratify with CT cardiac coronary calcium scoring.  Depending on the score, can consider stress test as well. Obtain LP(a) as well for additional risk stratification.  Reproducible chest pain on exam which seems to be similar to what she is noting.  She has not had any of the symptoms with exertion and that certainly has not been exacerbated with exertion.  Usually occur when at night when she is laying down, often associated with rolling over.  Sounds very musculoskeletal in nature.  I have asked that she monitor her for symptoms over the next several months and we can reassess that we have performed Coronary Calcium Score.  Based on the severity of Coronary Calcium Score we may or may not want to consider further evaluation with either simple test such as GXT or potentially more definitive evaluation with Coronary CTA if she is still having symptoms.  Plan: Check Coronary Calcium Score along with LP(a) for risk stratification.

## 2022-09-23 NOTE — Progress Notes (Signed)
Primary Care Provider: Carlos Levering, [place] [place] Cardiologist: Glenetta Hew, MD Electrophysiologist: None  Clinic Note: Chief Complaint  Patient presents with   New Patient (Initial Visit)    Referral for chest pain.  Concerned because sister recently had a heart attack   Chest Pain    "Off-and-on"  Off-and-on chest pain  ===================================  ASSESSMENT/PLAN   Problem List Items Addressed This Visit       Cardiology Problems   HLD (hyperlipidemia) (Chronic)    Hyperlipidemia.  She has a family history of CAD now with her sister along with hypertension herself.  Would like to try to risk stratify a little bit more to better identify how well we want to control her lipids.  Pravastatin dose was recently increased because of pretty significant elevated LDL.  Target LDL for her with her family history of CAD should be at least less than 100.  Not sure from statin when it is there. Coronary Calcium Scoring Will Help Stratify.  We will also check LP(a) given the fact that her sister just had a heart attack Make sure that she does not have a risk for progressive CAD, that is not adequately treated by statins.      Essential hypertension (Chronic)    Borderline blood pressure today.  Apparently was going to increase her dose of ARB.  Defer monitoring to PCP for now.        Other   Precordial chest pain - Primary (Chronic)    Patient is here with a 42-monthhistory of chest tightness mostly at night and is positional in nature.  Most likely musculoskeletal in nature given tenderness to palpation over sternal costal joint on exam.  Much less likely to be coronary spasm as she does not have any shortness of breath or other concerning symptoms. Although, does have risk factors of hypertension and hyperlipidemia with LDL not at goal (E LDL 190) and family history of sister with MI.  Plan: Will risk stratify with CT cardiac coronary calcium scoring.   Depending on the score, can consider stress test as well. Obtain LP(a) as well for additional risk stratification.  Reproducible chest pain on exam which seems to be similar to what she is noting.  She has not had any of the symptoms with exertion and that certainly has not been exacerbated with exertion.  Usually occur when at night when she is laying down, often associated with rolling over.  Sounds very musculoskeletal in nature.  I have asked that she monitor her for symptoms over the next several months and we can reassess that we have performed Coronary Calcium Score.  Based on the severity of Coronary Calcium Score we may or may not want to consider further evaluation with either simple test such as GXT or potentially more definitive evaluation with Coronary CTA if she is still having symptoms.  Plan: Check Coronary Calcium Score along with LP(a) for risk stratification.      Relevant Orders   EKG 12-Lead   CT CARDIAC SCORING (SELF PAY ONLY)    ===================================  HPI:    Leslie AMICKis a 66y.o. female with a PMH notable for HTN, HLD (and Family History of PrematureCAD-Sister with recent MI), as well as ulcerative colitis (s/p total proctocolectomy) who presents today as a new patient referred by PCP for chest tightness at the request of WCarlos Levering PA-C.  Leslie MENDELwas recently seen by JGeoffry Paradise PA for periodic wellness exam.  Noted that her blood pressure is running little higher than usual at home.  In this visit she denied real chest pain but noted "tightness in the middle of her chest lasting about a minute.  Mostly happens at rest.  Noted pain in her chest with exertion.  No exertional dyspnea.  Also notes some heartburn.  Recent Hospitalizations: None  Reviewed  CV studies:    The following studies were reviewed today: (if available, images/films reviewed: From Epic Chart or Care Everywhere) None  Interval History:   Leslie Harrison is a is  here today for evaluation of chest pain.  She noted that these episodes started about 6 months ago, only happens at nighttime and awakens her from sleep.  Last 1 minute.  She falls asleep on her back and rolls to her side, which resolves the pain. This pain never happens during the daytime, and is never exacerbated by activity.. She is able to go up the stairs from her basement to the first floor the house without anginal symptoms.  She does feel that her heart rate goes quickly when going up stairs.  She walks for exercise, and is able to do so without difficulty. Denies any HF symptoms including lower extremity edema, PND, orthopnea.  Sister had an MI, but family history otherwise unremarkable.  (Noted on reassessment the her sister recently had a heart catheterization with a blockage for what she says seems to recall was heart attack)  Is a prior smoker, but quit smoking 30 years ago.  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  CV Review of Symptoms (Summary): Cardiovascular ROS: positive for - precoridal chest pain reproducible on exam, otherwise negative ROS   REVIEWED OF SYSTEMS   Review of Systems  Constitutional:  Negative for chills, fever and weight loss.  Respiratory:  Negative for cough.   Cardiovascular:  Negative for palpitations, orthopnea, claudication, leg swelling and PND.  Musculoskeletal:  Negative for myalgias.  Neurological:  Negative for dizziness and headaches.    I have reviewed and (if needed) personally updated the patient's problem list, medications, allergies, past medical and surgical history, social and family history.   PAST MEDICAL HISTORY   Past Medical History:  Diagnosis Date   Anemia    Arthritis    hands   Borderline hypertension    Carpal tunnel syndrome, bilateral    R > L   Depression    Hyperlipidemia    Hypertension    Kidney disease, chronic, stage II (mild, EGFR 60+ ml/min)    Osteopenia    PONV (postoperative  nausea and vomiting)    Renal calculus, right    Right ureteral stone    Trigger finger, right middle finger    Ulcerative colitis ([place])    Urgency of urination    Wears glasses    Wears partial dentures    lower    PAST SURGICAL HISTORY   Past Surgical History:  Procedure Laterality Date   COLOSTOMY TAKEDOWN  2005   CYSTOSCOPY WITH RETROGRADE PYELOGRAM, URETEROSCOPY AND STENT PLACEMENT Right 06/10/2016   Procedure: [place], URETEROSCOPY AND STENT PLACEMENT;  Surgeon: Alexis Frock, MD;  Location: [facility];  Service: Urology;  Laterality: Right;  75 MINS NEEDS DIGITAL URETEROSCOPE [phone] [id number]   HOLMIUM LASER APPLICATION Right 11/27/1733   Procedure: HOLMIUM LASER APPLICATION;  Surgeon: Alexis Frock, MD;  [facility];  Service: Urology;  Laterality: Right;   LAPAROTOMY N/A 05/12/2016  Procedure: EXPLORATORY LAPAROTOMY  AND  OMENTECTOMY;  Surgeon: Armandina Gemma, MD;  Location: [facility];  Service: General;  Laterality: N/A;   STONE EXTRACTION WITH BASKET Right 06/10/2016   Procedure: STONE EXTRACTION WITH BASKET;  Surgeon: Alexis Frock, MD;  Location: [facility];  Service: Urology;  Laterality: Right;   SUBTOTAL COLECTOMY W/ ILEOSTOMY  2004   in [place] Right 10/15/2020   Procedure: RELEASE TRIGGER FINGER/A-1 PULLEY RIGHT MIDDLE FINGER;  Surgeon: Daryll Brod, MD;  Location: [place] [facility];  Service: Orthopedics;  Laterality: Right;  IV REGIONAL FOREARM BLOCK   TUBAL LIGATION  1978     There is no immunization history on file for this patient.  MEDICATIONS/ALLERGIES   Current Meds  Medication Sig   Cyanocobalamin (VITAMIN B 12 PO) Take by mouth.   Multiple Vitamin (MULTIVITAMIN) capsule Take 1 capsule by mouth daily.   pravastatin (PRAVACHOL) 10 MG tablet Take 10 mg by mouth every morning.   sertraline (ZOLOFT) 50 MG tablet Take 150 mg by  mouth every morning.   traMADol (ULTRAM) 50 MG tablet Take 1 tablet (50 mg total) by mouth every 6 (six) hours as needed.   valsartan (DIOVAN) 160 MG tablet Take 160 mg by mouth daily.    Allergies  Allergen Reactions   Penicillin Potassium-G [Penicillin G]     Hives     SOCIAL HISTORY/FAMILY HISTORY   Reviewed in Epic:  Pertinent findings:  Social History   Tobacco Use   Smoking status: Former    Years: 10.00    Types: Cigarettes    Quit date: 06/08/1986    Years since quitting: 36.3   Smokeless tobacco: Never  Substance Use Topics   Alcohol use: No   Drug use: No   Social History   Social History Narrative   Is a prior smoker, but quit smoking 30 years ago.  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   Walks just about every day for exercise.   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 Widowed at age 70 (2013).  Currently in a committed relationship with Sonia Side.    OBJCTIVE -PE, EKG, labs   Wt Readings from Last 3 Encounters:  09/23/22 159 lb 3.2 oz (72.2 kg)  10/15/20 155 lb 3.3 oz (70.4 kg)  06/10/16 146 lb (66.2 kg)   BP PCPs office 150 over 81 mmHg Physical Exam: BP 138/70 (BP Location: Left Arm, Patient Position: Sitting, Cuff Size: Normal)   Pulse 66   Ht _0  (1.499 m)   Wt 159 lb 3.2 oz (72.2 kg)   SpO2 98%   BMI 32.15 kg/m  Physical Exam Vitals reviewed.  Constitutional:      General: She is not in acute distress.    Appearance: Normal appearance.  HENT:     Head: Normocephalic and atraumatic.  Cardiovascular:     Rate and Rhythm: Normal rate and regular rhythm.     Pulses: Normal pulses.     Heart sounds: Normal heart sounds.  Pulmonary:     Effort: Pulmonary effort is normal.     Breath sounds: Normal breath sounds. No wheezing.  Chest:     Chest wall: Tenderness (R parasternal - point tenderness; reproduces CP) present.  Abdominal:     General: Abdomen is flat. Bowel sounds are normal.  There is no distension.     Palpations: Abdomen is soft. There is no mass.  Tenderness: There is no abdominal tenderness. There is no guarding.  Musculoskeletal:     Comments: Tenderness to palpation over right 4th sternocostal joint  Skin:    General: Skin is warm and dry.  Neurological:     General: No focal deficit present.     Mental Status: She is alert and oriented to person, place, and time.     Gait: Gait normal.  Psychiatric:        Mood and Affect: Mood normal.        Behavior: Behavior normal.        Thought Content: Thought content normal.        Judgment: Judgment normal.    General appearance: alert, cooperative, appears stated age, no distress, and mildly obese Neck: no adenopathy, no carotid bruit, no JVD, supple, symmetrical, trachea midline, and thyroid not enlarged, symmetric, no tenderness/mass/nodules Lungs: clear to auscultation bilaterally, normal percussion bilaterally, and no M/R/G.  Nonlabored. Heart: regular rate and rhythm, S1, S2 normal, no murmur, click, rub or gallop and normal apical impulse Abdomen: soft, non-tender; bowel sounds normal; no masses,  no organomegaly Extremities: extremities normal, atraumatic, no cyanosis or edema Pulses: 2+ and symmetric Neurologic: Grossly normal Point tenderness along the right costosternal border roughly third rib space.  This reproduces her pain.    Adult ECG Report  Rate: 66 ;  Rhythm: normal sinus rhythm;   Narrative Interpretation: Normal EKG Personally reviewed and signed by attending.   Recent Labs:    09/05/2022: TC 254, TG 177, HDL DL 64, LDL Calc 158!!! => PCP recently increased pravastatin dose to 40 mg. Na+ 141, K+ 4.3, Cl -107, HCO3-25, BUN 12, Cr 101, Glu 92,Ca++ 9.6.  AST 23, ALT 20, alk phos 92.  ALB 4.2, T protein 7.06 CBC: W6.9, H/H13.2/39.8; PLT 253. ==================================================  Current medicines are reviewed at length with the patient today.  (+/- concerns)  none  Notice: This dictation was prepared with Dragon dictation along with smart phrase technology. Any transcriptional errors that result from this process are unintentional and may not be corrected upon review.  Studies Ordered:   Orders Placed This Encounter  Procedures   CT CARDIAC SCORING (SELF PAY ONLY)   EKG 12-Lead   No orders of the defined types were placed in this encounter.   Patient Instructions / Medication Changes & Studies & Tests Ordered   Patient Instructions  Medication Instructions:   No changes    Lab Work: Not needed    Testing/Procedures:  CT coronary calcium score.   Test locations:  [place]   This is $99 out of pocket.        Your next appointment:   2 month(s)  The format for your next appointment:   In Person  Provider:   Glenetta Hew, MD    Other Instructions  -Monitor for worsening symptoms.  Symptoms do get worse with exertion.    Signed: Orvis Brill, DO  [place]  I have seen, examined and evaluated the patient along with the Resident Physician in clinic today.  I personally performed my own interview & exanimation.  After reviewing all the available data and chart, we discussed the patients laboratory, study & physical findings as well as symptoms in detail. I agree with her findings, examination as well as impression recommendations as per our discussion.    Attending adjustments int the full clinic noted annotated in [place].    Leonie Man, MD,  MS Glenetta Hew, M.D., M.S. Interventional Cardiologist  [place]  Pager # [phone] Phone # [phone] [address] [place], [place] [address]      Thank you for choosing [facility]!!

## 2022-09-23 NOTE — Patient Instructions (Signed)
Medication Instructions:   No changes    Lab Work: Not needed    Testing/Procedures:  CT coronary calcium score.   Test locations:  [place]   This is $99 out of pocket.   Coronary CalciumScan A coronary calcium scan is an imaging test used to look for deposits of calcium and other fatty materials (plaques) in the inner lining of the blood vessels of the heart (coronary arteries). These deposits of calcium and plaques can partly clog and narrow the coronary arteries without producing any symptoms or warning signs. This puts a person at risk for a heart attack. This test can detect these deposits before symptoms develop. Tell a health care provider about: Any allergies you have. All medicines you are taking, including vitamins, herbs, eye drops, creams, and over-the-counter medicines. Any problems you or family members have had with anesthetic medicines. Any blood disorders you have. Any surgeries you have had. Any medical conditions you have. Whether you are pregnant or may be pregnant. What are the risks? Generally, this is a safe procedure. However, problems may occur, including: Harm to a pregnant woman and her unborn baby. This test involves the use of radiation. Radiation exposure can be dangerous to a pregnant woman and her unborn baby. If you are pregnant, you generally should not have this procedure done. Slight increase in the risk of cancer. This is because of the radiation involved in the test. What happens before the procedure? No preparation is needed for this procedure. What happens during the procedure? You will undress and remove any jewelry around your neck or chest. You will put on a hospital gown. Sticky electrodes will be placed on your chest. The electrodes will be connected to an electrocardiogram (ECG) machine to record a tracing of the electrical activity of your heart. A CT scanner will take pictures of your heart.  During this time, you will be asked to lie still and hold your breath for 2-3 seconds while a picture of your heart is being taken. The procedure may vary among health care providers and hospitals. What happens after the procedure? You can get dressed. You can return to your normal activities. It is up to you to get the results of your test. Ask your health care provider, or the department that is doing the test, when your results will be ready. Summary A coronary calcium scan is an imaging test used to look for deposits of calcium and other fatty materials (plaques) in the inner lining of the blood vessels of the heart (coronary arteries). Generally, this is a safe procedure. Tell your health care provider if you are pregnant or may be pregnant. No preparation is needed for this procedure. A CT scanner will take pictures of your heart. You can return to your normal activities after the scan is done. This information is not intended to replace advice given to you by your health care provider. Make sure you discuss any questions you have with your health care provider. Document Released: 03/31/2008 Document Revised: 08/22/2016 Document Reviewed: 08/22/2016 Elsevier Interactive Patient Education  2017 [place]: At [facility], you and your health needs are our priority.  As part of our continuing mission to provide you with exceptional heart care, we have created designated Provider Care Teams.  These Care Teams include your primary Cardiologist (physician) and Advanced Practice Providers (APPs -  Physician Assistants and Nurse Practitioners) who all work together to provide you with the care you  need, when you need it.     Your next appointment:   2 month(s)  The format for your next appointment:   In Person  Provider:   Glenetta Hew, MD    Other Instructions

## 2022-09-23 NOTE — Assessment & Plan Note (Signed)
Hyperlipidemia.  She has a family history of CAD now with her sister along with hypertension herself.  Would like to try to risk stratify a little bit more to better identify how well we want to control her lipids.  Pravastatin dose was recently increased because of pretty significant elevated LDL.  Target LDL for her with her family history of CAD should be at least less than 100.  Not sure from statin when it is there. Coronary Calcium Scoring Will Help Stratify.  We will also check LP(a) given the fact that her sister just had a heart attack Make sure that she does not have a risk for progressive CAD, that is not adequately treated by statins.

## 2022-10-03 DIAGNOSIS — I10 Essential (primary) hypertension: Secondary | ICD-10-CM | POA: Diagnosis not present

## 2022-10-03 DIAGNOSIS — E785 Hyperlipidemia, unspecified: Secondary | ICD-10-CM | POA: Diagnosis not present

## 2022-11-17 ENCOUNTER — Ambulatory Visit (HOSPITAL_BASED_OUTPATIENT_CLINIC_OR_DEPARTMENT_OTHER)
Admission: RE | Admit: 2022-11-17 | Discharge: 2022-11-17 | Disposition: A | Discharge status: Home / Self Care | Payer: Medicare Other | Source: Ambulatory Visit | Attending: Cardiology | Admitting: Cardiology

## 2022-11-17 DIAGNOSIS — R072 Precordial pain: Secondary | ICD-10-CM | POA: Insufficient documentation

## 2022-11-28 DIAGNOSIS — B028 Zoster with other complications: Secondary | ICD-10-CM | POA: Diagnosis not present

## 2022-12-02 ENCOUNTER — Ambulatory Visit: Payer: Medicare Other | Admitting: Cardiology

## 2022-12-12 ENCOUNTER — Other Ambulatory Visit: Payer: Self-pay

## 2022-12-12 ENCOUNTER — Ambulatory Visit: Payer: Medicare Other | Attending: Cardiology | Admitting: Nurse Practitioner

## 2022-12-12 ENCOUNTER — Encounter: Payer: Self-pay | Admitting: Nurse Practitioner

## 2022-12-12 VITALS — BP 118/70 | HR 92 | Ht 59.0 in | Wt 157.2 lb

## 2022-12-12 DIAGNOSIS — E785 Hyperlipidemia, unspecified: Secondary | ICD-10-CM

## 2022-12-12 DIAGNOSIS — R072 Precordial pain: Secondary | ICD-10-CM

## 2022-12-12 DIAGNOSIS — I10 Essential (primary) hypertension: Secondary | ICD-10-CM | POA: Diagnosis not present

## 2022-12-12 DIAGNOSIS — R931 Abnormal findings on diagnostic imaging of heart and coronary circulation: Secondary | ICD-10-CM | POA: Diagnosis not present

## 2022-12-12 MED ORDER — METOPROLOL TARTRATE 100 MG PO TABS: ORAL_TABLET | ORAL | 0 refills | Status: DC

## 2022-12-12 NOTE — Patient Instructions (Signed)
Medication Instructions:  Metoprolol Tartrate 100 mg take 1 tab 2 hours prior to procedure.  *If you need a refill on your cardiac medications before your next appointment, please call your pharmacy*   Lab Work: BMET today. Return for lab work in 1 month Fasting Lipid, LFTs.    If you have labs (blood work) drawn today and your tests are completely normal, you will receive your results only by: [place] (if you have MyChart) OR A paper copy in the mail If you have any lab test that is abnormal or we need to change your treatment, we will call you to review the results.   Testing/Procedures:   Your cardiac CT will be scheduled at one of the below locations:   [facility] [address] [place], [place] [address] [phone] [place], [place] [address] [phone]  [facility] [place], [place] [address] [phone]  If scheduled at [facility], [facility], [facility], please arrive at the [facility] and Children's Entrance (Entrance C2) of [facility] 30 minutes prior to test start time. You can use the FREE valet parking offered at entrance C (encouraged to control the heart rate for the test)  Proceed to the [facility] Radiology Department (first floor) to check-in and test prep.  All radiology patients and guests should use entrance C2 at [facility], accessed from [facility], even though the hospital's physical address listed is [address].    If scheduled at [facility] or [facility], please arrive 15 mins early for check-in and test prep.   Please follow these instructions carefully (unless otherwise directed):  Hold all erectile dysfunction medications at least 3 days (72 hrs) prior to test. (Ie viagra, cialis, sildenafil, tadalafil, etc) We will  administer nitroglycerin during this exam.   On the Night Before the Test: Be sure to Drink plenty of water. Do not consume any caffeinated/decaffeinated beverages or chocolate 12 hours prior to your test. Do not take any antihistamines 12 hours prior to your test. If the patient has contrast allergy: Patient will need a prescription for Prednisone and very clear instructions (as follows): Prednisone 50 mg - take 13 hours prior to test Take another Prednisone 50 mg 7 hours prior to test Take another Prednisone 50 mg 1 hour prior to test Take Benadryl 50 mg 1 hour prior to test Patient must complete all four doses of above prophylactic medications. Patient will need a ride after test due to Benadryl.  On the Day of the Test: Drink plenty of water until 1 hour prior to the test. Do not eat any food 1 hour prior to test. You may take your regular medications prior to the test.  Take metoprolol (Lopressor) two hours prior to test. If you take Furosemide/Hydrochlorothiazide/Spironolactone, please HOLD on the morning of the test. FEMALES- please wear underwire-free bra if available, avoid dresses & tight clothing   *For Clinical Staff only. Please instruct patient the following:* Heart Rate Medication Recommendations for Cardiac CT  Resting HR < 50 bpm  No medication  Resting HR 50-60 bpm and BP >110/50 mmHG   Consider Metoprolol tartrate 25 mg PO 90-120 min prior to scan  Resting HR 60-65 bpm and BP >110/50 mmHG  Metoprolol tartrate 50 mg PO 90-120 minutes prior to scan   Resting HR > 65 bpm and  BP >110/50 mmHG  Metoprolol tartrate 100 mg PO 90-120 minutes prior to scan  Consider Ivabradine 10-15 mg PO or a calcium channel blocker for resting HR >60 bpm and contraindication to metoprolol tartrate  Consider Ivabradine 10-15 mg PO in combination with metoprolol tartrate for HR >80 bpm         After the Test: Drink plenty of water. After receiving IV contrast, you may experience a  mild flushed feeling. This is normal. On occasion, you may experience a mild rash up to 24 hours after the test. This is not dangerous. If this occurs, you can take Benadryl 25 mg and increase your fluid intake. If you experience trouble breathing, this can be serious. If it is severe call 911 IMMEDIATELY. If it is mild, please call our office. If you take any of these medications: Glipizide/Metformin, Avandament, Glucavance, please do not take 48 hours after completing test unless otherwise instructed.  We will call to schedule your test 2-4 weeks out understanding that some insurance companies will need an authorization prior to the service being performed.   For non-scheduling related questions, please contact the cardiac imaging nurse navigator should you have any questions/concerns: Marchia Bond, Cardiac Imaging Nurse Navigator Gordy Clement, Cardiac Imaging Nurse Navigator [facility] Direct Office Dial: [phone]   For scheduling needs, including cancellations and rescheduling, please call [place], [phone].    Follow-Up: At [facility], you and your health needs are our priority.  As part of our continuing mission to provide you with exceptional heart care, we have created designated Provider Care Teams.  These Care Teams include your primary Cardiologist (physician) and Advanced Practice Providers (APPs -  Physician Assistants and Nurse Practitioners) who all work together to provide you with the care you need, when you need it.  We recommend signing up for the patient portal called "MyChart".  Sign up information is provided on this After Visit Summary.  MyChart is used to connect with patients for Virtual Visits (Telemedicine).  Patients are able to view lab/test results, encounter notes, upcoming appointments, etc.  Non-urgent messages can be sent to your provider as well.   To learn more about what you can do with MyChart, go to  [email or web address].    Your next appointment:   6-8 week(s)  Provider:   Diona Browner, NP        Other Instructions

## 2022-12-12 NOTE — Progress Notes (Signed)
Office Visit    Patient Name: Leslie Harrison Date of Encounter: 12/12/2022  Primary Care Provider:  Carlos Levering, PA-C Primary Cardiologist:  Glenetta Hew, MD  Chief Complaint    67 year old female with a history of elevated coronary calcium score, atypical chest pain, hypertension, hyperlipidemia, and arthritis who presents for follow-up related to elevated coronary calcium score.  Past Medical History    Past Medical History:  Diagnosis Date   Anemia    Arthritis    hands   Borderline hypertension    Carpal tunnel syndrome, bilateral    R > L   Depression    Hyperlipidemia    Hypertension    Kidney disease, chronic, stage II (mild, EGFR 60+ ml/min)    Osteopenia    PONV (postoperative nausea and vomiting)    Renal calculus, right    Right ureteral stone    Trigger finger, right middle finger    Ulcerative colitis ([place])    Urgency of urination    Wears glasses    Wears partial dentures    lower   Past Surgical History:  Procedure Laterality Date   COLOSTOMY TAKEDOWN  2005   CYSTOSCOPY WITH RETROGRADE PYELOGRAM, URETEROSCOPY AND STENT PLACEMENT Right 06/10/2016   Procedure: [place], URETEROSCOPY AND STENT PLACEMENT;  Surgeon: Alexis Frock, MD;  Location: [facility];  Service: Urology;  Laterality: Right;  75 MINS NEEDS DIGITAL URETEROSCOPE [phone] [id number]   HOLMIUM LASER APPLICATION Right [id number]   Procedure: HOLMIUM LASER APPLICATION;  Surgeon: Alexis Frock, MD;  Location: [facility];  Service: Urology;  Laterality: Right;   LAPAROTOMY N/A 05/12/2016   Procedure: EXPLORATORY LAPAROTOMY  AND  OMENTECTOMY;  Surgeon: Armandina Gemma, MD;  Location: [facility];  Service: General;  Laterality: N/A;   STONE EXTRACTION WITH BASKET Right 06/10/2016   Procedure: STONE EXTRACTION WITH BASKET;  Surgeon: Alexis Frock, MD;  Location: [facility];  Service: Urology;  Laterality: Right;    SUBTOTAL COLECTOMY W/ ILEOSTOMY  2004   in [place] Right 10/15/2020   Procedure: RELEASE TRIGGER FINGER/A-1 PULLEY RIGHT MIDDLE FINGER;  Surgeon: Daryll Brod, MD;  Location: [place];  Service: Orthopedics;  Laterality: Right;  IV REGIONAL FOREARM BLOCK   TUBAL LIGATION  1978    Allergies  Allergies  Allergen Reactions   Penicillin Potassium-G [Penicillin G]     Hives      Labs/Other Studies Reviewed    The following studies were reviewed today: Coronary Calcium Scoring 11/2022: EXAM: Coronary Calcium Score   TECHNIQUE: A gated, non-contrast computed tomography scan of the heart was performed using 69m slice thickness. Axial images were analyzed on a dedicated workstation. Calcium scoring of the coronary arteries was performed using the Agatston method.   FINDINGS: Coronary arteries: Normal origins.   Coronary Calcium Score:   Left main: 0   Left anterior descending artery: 126   Left circumflex artery: 129   Right coronary artery: 118   Total: 373   Percentile: 93rd   Pericardium: Normal.   Ascending Aorta: Normal caliber.  Aortic atherosclerosis.   [facility]cardiac: [facility]   IMPRESSION: Coronary calcium score of 373. This was 93rd percentile for age-, race-, and sex-matched controls.   Aortic atherosclerosis.   Recent Labs: No results found for requested labs within last 365 days.  Recent Lipid Panel No results found for: "CHOL", "TRIG", "HDL", "CHOLHDL", "VLDL", "LDLCALC", "LDLDIRECT"  History  of Present Illness    67 year old female with the above past medical history including elevated coronary calcium score, atypical chest pain, hypertension, hyperlipidemia, and arthritis.  She was referred to Dr. Ellyn Hack in December 2023 in the setting of family history of CAD, intermittent chest discomfort.  She was last seen in the office on 09/23/2022 and reported intermittent  chest discomfort that occurred primarily at night, often associated with rolling over in bed.  Her chest discomfort was atypical, thought to be musculoskeletal.  Nonetheless, coronary calcium scoring was performed for further risk stratification, which revealed coronary calcium score of 373 (93rd percentile).  It was noted that should she have ongoing symptoms, could consider coronary CT angiogram to better assess CAD.  She presents today for follow-up.  Since her last visit she has been stable overall from a cardiac standpoint.  Her PCP increased her pravastatin in the setting of elevated coronary calcium score.  She has not had repeat lipids. Additionally, valsartan was increased to 320 mg daily in setting of elevated BP.  She continues to have symptoms of chest pressure/tightness when going up stairs from her basement.  Her symptoms resolve with just a few minutes of rest.  She denies any dyspnea, palpitations.  Other than her intermittent chest discomfort, she reports feeling well.  Home Medications    Current Outpatient Medications  Medication Sig Dispense Refill   Calcium Carb-Cholecalciferol (CALCIUM 500/VITAMIN D) 500-3.125 MG-MCG TABS 1 tablet with a meal Orally Once a day     Cyanocobalamin (VITAMIN B 12 PO) Take by mouth.     metoprolol tartrate (LOPRESSOR) 100 MG tablet Take 1 tablet 2 hours prior to CT procedure. 1 tablet 0   Multiple Vitamin (MULTIVITAMIN) capsule Take 1 capsule by mouth daily.     Multiple Vitamins-Minerals (CENTRUM SILVER) tablet 1 tablet Orally once daily     pravastatin (PRAVACHOL) 10 MG tablet Take 10 mg by mouth every morning.     sertraline (ZOLOFT) 50 MG tablet Take 150 mg by mouth every morning.     valsartan (DIOVAN) 160 MG tablet Take 160 mg by mouth daily.     No current facility-administered medications for this visit.     Review of Systems    She denies palpitations, dyspnea, pnd, orthopnea, n, v, dizziness, syncope, edema, weight gain, or early  satiety. All other systems reviewed and are otherwise negative except as noted above.   Physical Exam    VS:  BP 118/70   Pulse 92   Ht '4\' 11"'$  (1.499 m)   Wt 157 lb 3.2 oz (71.3 kg)   SpO2 95%   BMI 31.75 kg/m  GEN: Well nourished, well developed, in no acute distress. HEENT: normal. Neck: Supple, no JVD, carotid bruits, or masses. Cardiac: RRR, no murmurs, rubs, or gallops. No clubbing, cyanosis, edema.  Radials/DP/PT 2+ and equal bilaterally.  Respiratory:  Respirations regular and unlabored, clear to auscultation bilaterally. GI: Soft, nontender, nondistended, BS + x 4. MS: no deformity or atrophy. Skin: warm and dry, no rash. Neuro:  Strength and sensation are intact. Psych: Normal affect.  Accessory Clinical Findings    ECG personally reviewed by me today - no EKG in office today.   Lab Results  Component Value Date   WBC 11.8 (H) 05/17/2016   HGB 12.6 06/10/2016   HCT 37.0 06/10/2016   MCV 93.7 05/17/2016   PLT 388 05/17/2016   Lab Results  Component Value Date   CREATININE 0.90 06/10/2016   BUN 15 06/10/2016  NA 141 06/10/2016   K 3.8 06/10/2016   CL 105 06/10/2016   CO2 25 05/17/2016   Lab Results  Component Value Date   ALT 34 05/17/2016   AST 54 (H) 05/17/2016   ALKPHOS 95 05/17/2016   BILITOT 1.1 05/17/2016   No results found for: "CHOL", "HDL", "LDLCALC", "LDLDIRECT", "TRIG", "CHOLHDL"  No results found for: "HGBA1C"  Assessment & Plan   1. Elevated coronary calcium score/atypical chest pain: Coronary calcium scoring was performed for further risk stratification, which revealed coronary calcium score of 373, 93rd percentile.  She continues to note intermittent chest tightness/pressure, particularly when climbing up stairs from her basement.  Her symptoms resolved after a few minutes of rest.  She denies any associated symptoms.  Discussed possible ischemic evaluation-through shared decision making, will pursue coronary CT angiogram.  Will check BMET  today in anticipation of CT.  2. Hypertension: BP well controlled. Continue current antihypertensive regimen.   3. Hyperlipidemia: LDL was 143 in 08/2021.  Her PCP recently increased her pravastatin to 40 mg daily.  Will plan for repeat FLP, LFTs in 4 weeks.  If LDL remains elevated above goal, would recommend transition to high intensity statin therapy.  4. Disposition: Follow-up in 6 to 8 weeks.     Lenna Sciara, NP 12/12/2022, 1:12 PM

## 2022-12-13 LAB — BASIC METABOLIC PANEL
BUN/Creatinine Ratio: 19 (ref 12–28)
BUN: 18 mg/dL (ref 8–27)
CO2: 20 mmol/L (ref 20–29)
Calcium: 9.4 mg/dL (ref 8.7–10.3)
Chloride: 107 mmol/L — ABNORMAL HIGH (ref 96–106)
Creatinine, Ser: 0.94 mg/dL (ref 0.57–1.00)
Glucose: 105 mg/dL — ABNORMAL HIGH (ref 70–99)
Potassium: 4.4 mmol/L (ref 3.5–5.2)
Sodium: 141 mmol/L (ref 134–144)
eGFR: 67 mL/min/{1.73_m2} (ref >59)

## 2022-12-16 ENCOUNTER — Telehealth: Payer: Self-pay

## 2022-12-16 NOTE — Telephone Encounter (Signed)
Spoke with pt. Pt was notified of lab results and will proceed with the coronary CTA.

## 2022-12-19 ENCOUNTER — Telehealth (HOSPITAL_COMMUNITY): Payer: Self-pay | Admitting: *Deleted

## 2022-12-19 ENCOUNTER — Telehealth (HOSPITAL_COMMUNITY): Payer: Self-pay | Admitting: Emergency Medicine

## 2022-12-19 NOTE — Telephone Encounter (Signed)
Patient returning call about her upcoming cardiac imaging study; pt verbalizes understanding of appt date/time, parking situation and where to check in, pre-test NPO status and medications ordered, and verified current allergies; name and call back number provided for further questions should they arise  Gordy Clement RN Navigator Cardiac Imaging [facility] [phone] office [phone] cell  Patient to hold her Diovan and take '100mg'$  metoprolol tartrate two hours prior to her cardiac CT scan. She is aware to arrive at 12pm.

## 2022-12-19 NOTE — Telephone Encounter (Signed)
Unable to leave vm Sumiya Mamaril RN Navigator Cardiac Imaging [facility] [phone] Office  [phone] Cell  

## 2022-12-20 ENCOUNTER — Ambulatory Visit (HOSPITAL_COMMUNITY): Admission: RE | Admit: 2022-12-20 | Payer: Medicare Other | Source: Ambulatory Visit

## 2022-12-23 ENCOUNTER — Telehealth (HOSPITAL_COMMUNITY): Payer: Self-pay | Admitting: *Deleted

## 2022-12-23 NOTE — Telephone Encounter (Signed)
Reaching out to patient to offer assistance regarding upcoming cardiac imaging study; pt verbalizes understanding of appt date/time, parking situation and where to check in, pre-test NPO status and medications ordered, and verified current allergies; name and call back number provided for further questions should they arise  Gordy Clement RN Navigator Cardiac Imaging [facility] [phone] office [phone] cell  Patient to hold her Diovan and take '100mg'$  metoprolol tartrate two hours prior to her cardiac CT scan. She is aware to arrive at 10:30am.

## 2022-12-27 ENCOUNTER — Ambulatory Visit (HOSPITAL_COMMUNITY)
Admission: RE | Admit: 2022-12-27 | Discharge: 2022-12-27 | Disposition: A | Discharge status: Home / Self Care | Payer: Medicare Other | Source: Ambulatory Visit | Attending: Nurse Practitioner | Admitting: Nurse Practitioner

## 2022-12-27 DIAGNOSIS — I700 Atherosclerosis of aorta: Secondary | ICD-10-CM | POA: Diagnosis not present

## 2022-12-27 DIAGNOSIS — R931 Abnormal findings on diagnostic imaging of heart and coronary circulation: Secondary | ICD-10-CM | POA: Diagnosis not present

## 2022-12-27 DIAGNOSIS — R072 Precordial pain: Secondary | ICD-10-CM | POA: Diagnosis not present

## 2022-12-27 MED ORDER — NITROGLYCERIN 0.4 MG SL SUBL
0.8000 mg | SUBLINGUAL_TABLET | Freq: Once | SUBLINGUAL | Status: AC
  Administered 2022-12-27: 0.8 mg via SUBLINGUAL

## 2022-12-27 MED ORDER — IOHEXOL 350 MG/ML SOLN
100.0000 mL | Freq: Once | INTRAVENOUS | Status: AC | PRN
  Administered 2022-12-27: 100 mL via INTRAVENOUS

## 2022-12-27 MED ORDER — NITROGLYCERIN 0.4 MG SL SUBL
SUBLINGUAL_TABLET | SUBLINGUAL | Status: AC
Start: 2022-12-27 — End: 2022-12-27
  Filled 2022-12-27: qty 2

## 2023-01-05 ENCOUNTER — Telehealth: Payer: Self-pay

## 2023-01-05 NOTE — Telephone Encounter (Signed)
Spoke with pt. Pt was notified of Coronary CTA results. Pt will stop by next week to complete lab work. Pt will continue her current medication and f/u as planned 01/23/2023 with Diona Browner, NP.

## 2023-01-10 DIAGNOSIS — R072 Precordial pain: Secondary | ICD-10-CM | POA: Diagnosis not present

## 2023-01-10 DIAGNOSIS — I10 Essential (primary) hypertension: Secondary | ICD-10-CM | POA: Diagnosis not present

## 2023-01-10 DIAGNOSIS — E785 Hyperlipidemia, unspecified: Secondary | ICD-10-CM | POA: Diagnosis not present

## 2023-01-10 DIAGNOSIS — R931 Abnormal findings on diagnostic imaging of heart and coronary circulation: Secondary | ICD-10-CM | POA: Diagnosis not present

## 2023-01-11 LAB — LIPID PANEL
Chol/HDL Ratio: 4.4 ratio (ref 0.0–4.4)
Cholesterol, Total: 255 mg/dL — ABNORMAL HIGH (ref 100–199)
HDL: 58 mg/dL (ref >39)
LDL Chol Calc (NIH): 163 mg/dL — ABNORMAL HIGH (ref 0–99)
Triglycerides: 184 mg/dL — ABNORMAL HIGH (ref 0–149)
VLDL Cholesterol Cal: 34 mg/dL (ref 5–40)

## 2023-01-11 LAB — HEPATIC FUNCTION PANEL
ALT: 22 IU/L (ref 0–32)
AST: 25 IU/L (ref 0–40)
Albumin: 4.4 g/dL (ref 3.9–4.9)
Alkaline Phosphatase: 113 IU/L (ref 44–121)
Bilirubin Total: 0.6 mg/dL (ref 0.0–1.2)
Bilirubin, Direct: 0.14 mg/dL (ref 0.00–0.40)
Total Protein: 7.2 g/dL (ref 6.0–8.5)

## 2023-01-13 ENCOUNTER — Telehealth: Payer: Self-pay

## 2023-01-13 NOTE — Telephone Encounter (Signed)
Attempted to contact pt to discuss lab results. VM is full and I wasn't able to leave a VM. Will call pt back.

## 2023-01-23 ENCOUNTER — Ambulatory Visit: Payer: Medicare Other | Attending: Nurse Practitioner | Admitting: Nurse Practitioner

## 2023-01-23 ENCOUNTER — Other Ambulatory Visit: Payer: Self-pay

## 2023-01-23 ENCOUNTER — Encounter: Payer: Self-pay | Admitting: Nurse Practitioner

## 2023-01-23 VITALS — BP 138/78 | HR 62 | Ht 59.0 in | Wt 154.0 lb

## 2023-01-23 DIAGNOSIS — R072 Precordial pain: Secondary | ICD-10-CM

## 2023-01-23 DIAGNOSIS — I2510 Atherosclerotic heart disease of native coronary artery without angina pectoris: Secondary | ICD-10-CM | POA: Diagnosis not present

## 2023-01-23 DIAGNOSIS — E785 Hyperlipidemia, unspecified: Secondary | ICD-10-CM

## 2023-01-23 DIAGNOSIS — I10 Essential (primary) hypertension: Secondary | ICD-10-CM | POA: Diagnosis not present

## 2023-01-23 MED ORDER — NITROGLYCERIN 0.4 MG SL SUBL: 0.4000 mg | SUBLINGUAL_TABLET | SUBLINGUAL | 5 refills | Status: AC | PRN

## 2023-01-23 MED ORDER — ROSUVASTATIN CALCIUM 20 MG PO TABS: 20.0000 mg | ORAL_TABLET | Freq: Every day | ORAL | 3 refills | Status: AC

## 2023-01-23 NOTE — Patient Instructions (Addendum)
Medication Instructions:  Nitroglycerin 0.4 mg/sl take as needed only for chest pain.  Stop Pravastatin as directed Start Rousovatatin 20 mg daily  *If you need a refill on your cardiac medications before your next appointment, please call your pharmacy*   Lab Work: Your physician recommends that you return for lab work in 6-8 weeks. Fasting Lipid Panel & LFTs  If you have labs (blood work) drawn today and your tests are completely normal, you will receive your results only by: MyChart Message (if you have MyChart) OR A paper copy in the mail If you have any lab test that is abnormal or we need to change your treatment, we will call you to review the results.   Testing/Procedures: NONE ordered at this time of appointment     Follow-Up: At [facility], you and your health needs are our priority.  As part of our continuing mission to provide you with exceptional heart care, we have created designated Provider Care Teams.  These Care Teams include your primary Cardiologist (physician) and Advanced Practice Providers (APPs -  Physician Assistants and Nurse Practitioners) who all work together to provide you with the care you need, when you need it.  We recommend signing up for the patient portal called "MyChart".  Sign up information is provided on this After Visit Summary.  MyChart is used to connect with patients for Virtual Visits (Telemedicine).  Patients are able to view lab/test results, encounter notes, upcoming appointments, etc.  Non-urgent messages can be sent to your provider as well.   To learn more about what you can do with MyChart, go to [email or web address].    Your next appointment:   6-7 month(s)  Provider:   Bryan Lemma, MD  or Bernadene Person NP   Other Instructions

## 2023-01-23 NOTE — Progress Notes (Signed)
Office Visit    Patient Name: Leslie Harrison Date of Encounter: 01/23/2023  Primary Care Provider:  Carilyn GoodpastureWillard, Jennifer, PA-C Primary Cardiologist:  Bryan Lemmaavid Harding, MD  Chief Complaint    67 year old female with a history of mild nonobstructive CAD, atypical chest pain, hypertension, hyperlipidemia, and arthritis who presents for follow-up related to CAD.  Past Medical History    Past Medical History:  Diagnosis Date   Anemia    Arthritis    hands   Borderline hypertension    Carpal tunnel syndrome, bilateral    R > L   Depression    Hyperlipidemia    Hypertension    Kidney disease, chronic, stage II (mild, EGFR 60+ ml/min)    Osteopenia    PONV (postoperative nausea and vomiting)    Renal calculus, right    Right ureteral stone    Trigger finger, right middle finger    Ulcerative colitis    Urgency of urination    Wears glasses    Wears partial dentures    lower   Past Surgical History:  Procedure Laterality Date   COLOSTOMY TAKEDOWN  2005   CYSTOSCOPY WITH RETROGRADE PYELOGRAM, URETEROSCOPY AND STENT PLACEMENT Right 06/10/2016   Procedure: CYSTOSCOPY WITH RETROGRADE PYELOGRAM, URETEROSCOPY AND STENT PLACEMENT;  Surgeon: Sebastian Acheheodore Manny, MD;  [facility] [place];  Service: Urology;  Laterality: Right;  75 MINS NEEDS DIGITAL URETEROSCOPE [phone] [id number]   HOLMIUM LASER APPLICATION Right 06/10/2016   Procedure: HOLMIUM LASER APPLICATION;  Surgeon: Sebastian Acheheodore Manny, MD;  [facility] [place];  Service: Urology;  Laterality: Right;   LAPAROTOMY N/A 05/12/2016   Procedure: EXPLORATORY LAPAROTOMY  AND  OMENTECTOMY;  Surgeon: Darnell Levelodd Gerkin, MD;  Location: [facility];  Service: General;  Laterality: N/A;   STONE EXTRACTION WITH BASKET Right 06/10/2016   Procedure: STONE EXTRACTION WITH BASKET;  Surgeon: Sebastian Acheheodore Manny, MD;  [facility] [place];  Service: Urology;  Laterality: Right;   SUBTOTAL COLECTOMY W/ ILEOSTOMY  2004   in  [place]   TRIGGER FINGER RELEASE Right 10/15/2020   Procedure: RELEASE TRIGGER FINGER/A-1 PULLEY RIGHT MIDDLE FINGER;  Surgeon: Cindee SaltKuzma, Gary, MD;  Location: [facility];  Service: Orthopedics;  Laterality: Right;  IV REGIONAL FOREARM BLOCK   TUBAL LIGATION  1978    Allergies  Allergies  Allergen Reactions   Penicillin Potassium-G [Penicillin G]     Hives      Labs/Other Studies Reviewed    The following studies were reviewed today: CCTA 12/2022: IMPRESSION: 1. Coronary calcium score of 376. This was 2793 percentile for age-, sex, and race-matched controls.   2. Normal coronary origin with right dominance.   3. Mild (25-49) stenoses in the ostium and proximal LAD, ostium of diagonal and proximal Lcx.   4. Aortic atherosclerosis.   RECOMMENDATIONS: CAD-RADS 2: Mild non-obstructive CAD (25-49%). Consider non-atherosclerotic causes of chest pain. Consider preventive therapy and risk factor modification.  Recent Labs: 12/12/2022: BUN 18; Creatinine, Ser 0.94; Potassium 4.4; Sodium 141 01/10/2023: ALT 22  Recent Lipid Panel    Component Value Date/Time   CHOL 255 (H) 01/10/2023 0823   TRIG 184 (H) 01/10/2023 0823   HDL 58 01/10/2023 0823   CHOLHDL 4.4 01/10/2023 0823   LDLCALC 163 (H) 01/10/2023 0823    History of Present Illness    67 year old female with the above past medical history including mild nonobstructive CAD, atypical chest pain, hypertension, hyperlipidemia, and arthritis.  She was referred to Dr. Herbie BaltimoreHarding in December  2023 in the setting of family history of CAD, intermittent chest discomfort.  Coronary calcium scoring was performed for further risk stratification, which revealed coronary calcium score of 373 (93rd percentile).  She was last last seen in the office on 12/12/2022 and noted symptoms of chest pressure/tightness when going up stairs from her basement.  Her symptoms resolve with just a few minutes of rest.  CCTA revealed coronary  calcium score of 376 (93rd percentile), mild nonobstructive CAD.  She presents today for follow-up. Since her last visit she has been stable from a cardiac standpoint.  She still notes occasional chest tightness when climbing stairs and when she is lying in bed at night.  Her symptoms have been overall stable.  Her PCP recently increased her valsartan to 320 mg daily.  BP has improved. Overall, she reports feeling well.  Home Medications    Current Outpatient Medications  Medication Sig Dispense Refill   Calcium Carb-Cholecalciferol (CALCIUM 500/VITAMIN D) 500-3.125 MG-MCG TABS 1 tablet with a meal Orally Once a day     Cyanocobalamin (VITAMIN B 12 PO) Take by mouth.     Multiple Vitamins-Minerals (CENTRUM SILVER) tablet 1 tablet Orally once daily     nitroGLYCERIN (NITROSTAT) 0.4 MG SL tablet Place 1 tablet (0.4 mg total) under the tongue every 5 (five) minutes as needed for chest pain. 45 tablet 5   rosuvastatin (CRESTOR) 20 MG tablet Take 1 tablet (20 mg total) by mouth daily. 90 tablet 3   sertraline (ZOLOFT) 50 MG tablet Take 150 mg by mouth every morning.     valsartan (DIOVAN) 320 MG tablet Take 320 mg by mouth daily.     No current facility-administered medications for this visit.     Review of Systems    She denies palpitations, dyspnea, pnd, orthopnea, n, v, dizziness, syncope, edema, weight gain, or early satiety. All other systems reviewed and are otherwise negative except as noted above.   Physical Exam    VS:  BP 138/78   Pulse 62   Ht 4\' 11"  (1.499 m)   Wt 154 lb (69.9 kg)   BMI 31.10 kg/m  GEN: Well nourished, well developed, in no acute distress. HEENT: normal. Neck: Supple, no JVD, carotid bruits, or masses. Cardiac: RRR, no murmurs, rubs, or gallops. No clubbing, cyanosis, edema.  Radials/DP/PT 2+ and equal bilaterally.  Respiratory:  Respirations regular and unlabored, clear to auscultation bilaterally. GI: Soft, nontender, nondistended, BS + x 4. MS: no  deformity or atrophy. Skin: warm and dry, no rash. Neuro:  Strength and sensation are intact. Psych: Normal affect.  Accessory Clinical Findings    ECG personally reviewed by me today - No EKG in office today.   Lab Results  Component Value Date   WBC 11.8 (H) 05/17/2016   HGB 12.6 06/10/2016   HCT 37.0 06/10/2016   MCV 93.7 05/17/2016   PLT 388 05/17/2016   Lab Results  Component Value Date   CREATININE 0.94 12/12/2022   BUN 18 12/12/2022   NA 141 12/12/2022   K 4.4 12/12/2022   CL 107 (H) 12/12/2022   CO2 20 12/12/2022   Lab Results  Component Value Date   ALT 22 01/10/2023   AST 25 01/10/2023   ALKPHOS 113 01/10/2023   BILITOT 0.6 01/10/2023   Lab Results  Component Value Date   CHOL 255 (H) 01/10/2023   HDL 58 01/10/2023   LDLCALC 163 (H) 01/10/2023   TRIG 184 (H) 01/10/2023   CHOLHDL 4.4 01/10/2023  No results found for: "HGBA1C"  Assessment & Plan    1. CAD/atypical chest pain: Coronary calcium scoring was performed for further risk stratification, which revealed coronary calcium score of 373, 93rd percentile. Follow-up CCTA  CCTA revealed coronary calcium score of 376 (93rd percentile), mild nonobstructive CAD. She continues to note mild intermittent chest tightness/pressure when climbing up stairs from her basement, and while lying in bed at night.  Her symptoms resolve after a few minutes of rest.  Symptoms overall stable.  Discussed possible addition of antianginal therapy, however, patient declines at this time.  Continue to monitor symptoms.  Discussed ED precautions.  Provided prescription for as needed nitroglycerin.  Aspirin therapy not indicated per guidelines. Continue valsartan, Crestor as below.    2. Hypertension: BP well controlled. Continue current antihypertensive regimen.    3. Hyperlipidemia: LDL was 163 in 12/2022 following increased dose of pravastatin.  Will stop pravastatin and start Crestor 20 mg daily.  Will repeat fasting lipid panel,  LFTs in 6 to 8 weeks.  4. Disposition: Follow-up in 6-7 months with Dr. Herbie Baltimore.       Joylene Grapes, NP 01/23/2023, 8:49 AM

## 2023-06-27 DIAGNOSIS — H5203 Hypermetropia, bilateral: Secondary | ICD-10-CM | POA: Diagnosis not present

## 2023-06-27 DIAGNOSIS — H52223 Regular astigmatism, bilateral: Secondary | ICD-10-CM | POA: Diagnosis not present

## 2023-06-27 DIAGNOSIS — H524 Presbyopia: Secondary | ICD-10-CM | POA: Diagnosis not present

## 2023-06-30 ENCOUNTER — Emergency Department (HOSPITAL_COMMUNITY): Payer: Medicare Other

## 2023-06-30 ENCOUNTER — Emergency Department (HOSPITAL_COMMUNITY)
Admission: EM | Admit: 2023-06-30 | Discharge: 2023-06-30 | Disposition: A | Discharge status: Home / Self Care | Payer: Medicare Other | Attending: Emergency Medicine | Admitting: Emergency Medicine

## 2023-06-30 DIAGNOSIS — I6502 Occlusion and stenosis of left vertebral artery: Secondary | ICD-10-CM | POA: Diagnosis not present

## 2023-06-30 DIAGNOSIS — S0990XA Unspecified injury of head, initial encounter: Secondary | ICD-10-CM | POA: Insufficient documentation

## 2023-06-30 DIAGNOSIS — S8255XA Nondisplaced fracture of medial malleolus of left tibia, initial encounter for closed fracture: Secondary | ICD-10-CM | POA: Insufficient documentation

## 2023-06-30 DIAGNOSIS — Z743 Need for continuous supervision: Secondary | ICD-10-CM | POA: Diagnosis not present

## 2023-06-30 DIAGNOSIS — Z79899 Other long term (current) drug therapy: Secondary | ICD-10-CM | POA: Insufficient documentation

## 2023-06-30 DIAGNOSIS — R519 Headache, unspecified: Secondary | ICD-10-CM | POA: Diagnosis not present

## 2023-06-30 DIAGNOSIS — W108XXA Fall (on) (from) other stairs and steps, initial encounter: Secondary | ICD-10-CM | POA: Diagnosis not present

## 2023-06-30 DIAGNOSIS — I10 Essential (primary) hypertension: Secondary | ICD-10-CM | POA: Diagnosis not present

## 2023-06-30 DIAGNOSIS — W19XXXA Unspecified fall, initial encounter: Secondary | ICD-10-CM

## 2023-06-30 DIAGNOSIS — M25572 Pain in left ankle and joints of left foot: Secondary | ICD-10-CM | POA: Diagnosis present

## 2023-06-30 DIAGNOSIS — R6889 Other general symptoms and signs: Secondary | ICD-10-CM | POA: Diagnosis not present

## 2023-06-30 DIAGNOSIS — R110 Nausea: Secondary | ICD-10-CM | POA: Diagnosis not present

## 2023-06-30 DIAGNOSIS — R404 Transient alteration of awareness: Secondary | ICD-10-CM | POA: Diagnosis not present

## 2023-06-30 LAB — I-STAT CHEM 8, ED
BUN: 12 mg/dL (ref 8–23)
Calcium, Ion: 0.93 mmol/L — ABNORMAL LOW (ref 1.15–1.40)
Chloride: 111 mmol/L (ref 98–111)
Creatinine, Ser: 0.8 mg/dL (ref 0.44–1.00)
Glucose, Bld: 92 mg/dL (ref 70–99)
HCT: 35 % — ABNORMAL LOW (ref 36.0–46.0)
Hemoglobin: 11.9 g/dL — ABNORMAL LOW (ref 12.0–15.0)
Potassium: 3.5 mmol/L (ref 3.5–5.1)
Sodium: 142 mmol/L (ref 135–145)
TCO2: 23 mmol/L (ref 22–32)

## 2023-06-30 MED ORDER — OXYCODONE HCL 5 MG PO TABS: 5.0000 mg | ORAL_TABLET | Freq: Four times a day (QID) | ORAL | 0 refills | Status: AC | PRN

## 2023-06-30 MED ORDER — ONDANSETRON 4 MG PO TBDP: 4.0000 mg | ORAL_TABLET | Freq: Three times a day (TID) | ORAL | 0 refills | Status: AC | PRN

## 2023-06-30 MED ORDER — MORPHINE SULFATE (PF) 4 MG/ML IV SOLN
4.0000 mg | Freq: Once | INTRAVENOUS | Status: AC
  Administered 2023-06-30: 4 mg via INTRAVENOUS
  Filled 2023-06-30: qty 1

## 2023-06-30 MED ORDER — FENTANYL CITRATE PF 50 MCG/ML IJ SOSY
50.0000 ug | PREFILLED_SYRINGE | Freq: Once | INTRAMUSCULAR | Status: AC
  Administered 2023-06-30: 50 ug via INTRAVENOUS
  Filled 2023-06-30: qty 1

## 2023-06-30 MED ORDER — ONDANSETRON HCL 4 MG/2ML IJ SOLN
4.0000 mg | Freq: Once | INTRAMUSCULAR | Status: AC
  Administered 2023-06-30: 4 mg via INTRAVENOUS
  Filled 2023-06-30: qty 2

## 2023-06-30 MED ORDER — MECLIZINE HCL 25 MG PO TABS
25.0000 mg | ORAL_TABLET | Freq: Once | ORAL | Status: AC
  Administered 2023-06-30: 25 mg via ORAL
  Filled 2023-06-30: qty 1

## 2023-06-30 MED ORDER — IOHEXOL 350 MG/ML SOLN
75.0000 mL | Freq: Once | INTRAVENOUS | Status: AC | PRN
  Administered 2023-06-30: 75 mL via INTRAVENOUS

## 2023-06-30 NOTE — ED Triage Notes (Signed)
Pt to ED via EMS from home. Pt states she lives alone. Pt states she was taking furniture down stairs yesterday at 2pm when she tripped and fell down 3 stairs and hit her head on concrete. Pt c/o vomiting afterwards. Pt has hematoma to back left head. Pt states she laid on ground x1.5 hours yesterday before being able to get up. Pt states she feels very dizzy and vomits any time she moves. Pt endorses headache. Pt also c/o left ankle bruising and states she cannot bare any weight on ankle. Pt denies any blood thinners. No LOC. EMS gave 4 zofran en route. GCS 15  EMS: 18LAC 146/78 60 HR 98% RA 16 RR 95 CBG

## 2023-06-30 NOTE — ED Notes (Signed)
Pt ambulated with walker appropriately.

## 2023-06-30 NOTE — ED Provider Notes (Signed)
[place] EMERGENCY DEPARTMENT AT [facility] Provider Note   CSN: [id number] Arrival date & time: 06/30/23  1357     History  Chief Complaint  Patient presents with   Leslie Harrison    Leslie Harrison is a 67 y.o. female.  Patient with history of hypertension, hyperlipidemia presents today with complaints of fall. She states that same occurred yesterday when she was carrying furniture down the stairs when she tripped and fell down 3 steps and struck the back of her head on concrete.  She did not lose consciousness.  She is not anticoagulated.  States that she laid on the ground for about an hour before she was able to get up and walk around.  She has been walking since.  She does note that she has significant pain to her left ankle which she suspect she injured in the fall.  She does note difficulty walking due to this pain.  She also has a headache in the back of her head where she struck her head on the concrete and notes she is significantly dizzy and nauseous with vomiting.  She states that every time she turns her head the room begins to spin and she gets nauseous and throws up.  She denies any vision changes, numbness/tingling, weakness, or lightheadedness.  She does note that EMS gave her some Zofran in route and that has improved her symptoms.  She does note that she has experienced vertigo previously, however it is never been as severe as it is right now.  Denies any fevers, chills, neck pain.  No other injuries or complaints.  The history is provided by the patient. No language interpreter was used.  Fall Associated symptoms include headaches.       Home Medications Prior to Admission medications   Medication Sig Start Date End Date Taking? Authorizing Provider  Calcium Carb-Cholecalciferol (CALCIUM 500/VITAMIN D) 500-3.125 MG-MCG TABS 1 tablet with a meal Orally Once a day    [provider]  Cyanocobalamin (VITAMIN B 12 PO) Take by mouth.    [provider]   Multiple Vitamins-Minerals (CENTRUM SILVER) tablet 1 tablet Orally once daily    [provider]  nitroGLYCERIN (NITROSTAT) 0.4 MG SL tablet Place 1 tablet (0.4 mg total) under the tongue every 5 (five) minutes as needed for chest pain. 01/23/23   Joylene Grapes, NP  rosuvastatin (CRESTOR) 20 MG tablet Take 1 tablet (20 mg total) by mouth daily. 01/23/23   Joylene Grapes, NP  sertraline (ZOLOFT) 50 MG tablet Take 150 mg by mouth every morning.    [provider]  valsartan (DIOVAN) 320 MG tablet Take 320 mg by mouth daily. 12/11/22   [provider]      Allergies    Penicillin potassium-g [penicillin g]    Review of Systems   Review of Systems  Musculoskeletal:  Positive for arthralgias.  Neurological:  Positive for dizziness and headaches.  All other systems reviewed and are negative.   Physical Exam Updated Vital Signs BP (!) 160/65 (BP Location: Right Arm)   Pulse (!) 56   Temp 98.6 F (37 C) (Oral)   Resp 19   SpO2 100%  Physical Exam Vitals and nursing note reviewed.  Constitutional:      General: She is not in acute distress.    Appearance: Normal appearance. She is normal weight. She is not ill-appearing, toxic-appearing or diaphoretic.  HENT:     Head: Normocephalic and atraumatic.  Eyes:  Extraocular Movements: Extraocular movements intact.     Conjunctiva/sclera: Conjunctivae normal.     Pupils: Pupils are equal, round, and reactive to light.  Cardiovascular:     Rate and Rhythm: Normal rate and regular rhythm.     Heart sounds: Normal heart sounds.  Pulmonary:     Effort: Pulmonary effort is normal. No respiratory distress.     Breath sounds: Normal breath sounds.  Chest:     Chest wall: No tenderness.  Abdominal:     General: Abdomen is flat.     Palpations: Abdomen is soft.     Tenderness: There is no abdominal tenderness.  Musculoskeletal:        General: Normal range of motion.     Cervical back: Normal range of motion and  neck supple. No tenderness.     Comments: No tenderness to palpation of cervical, thoracic, or lumbar spine.  No step-offs, lesions, deformity, or overlying skin changes.  TTP left medial malleolus of the ankle with associated bruising. No obvious deformity. ROM limited due to pain.   Skin:    General: Skin is warm and dry.  Neurological:     General: No focal deficit present.     Mental Status: She is alert and oriented to person, place, and time.     GCS: GCS eye subscore is 4. GCS verbal subscore is 5. GCS motor subscore is 6.     Sensory: Sensation is intact.     Motor: Motor function is intact.     Coordination: Coordination is intact.     Gait: Gait is intact.     Comments: Alert and oriented to self, place, time and event.    Speech is fluent, clear without dysarthria or dysphasia.    Strength 5/5 in upper/lower extremities   Sensation intact in upper/lower extremities    CN I not tested  CN II grossly intact visual fields bilaterally. Did not visualize posterior eye.  CN III, IV, VI PERRLA and EOMs intact bilaterally. No nystagmus CN V Intact sensation to sharp and light touch to the face  CN VII facial movements symmetric  CN VIII not tested  CN IX, X no uvula deviation, symmetric rise of soft palate  CN XI 5/5 SCM and trapezius strength bilaterally  CN XII Midline tongue protrusion, symmetric L/R movements   Psychiatric:        Mood and Affect: Mood normal.        Behavior: Behavior normal.     ED Results / Procedures / Treatments   Labs (all labs ordered are listed, but only abnormal results are displayed) Labs Reviewed  I-STAT CHEM 8, ED - Abnormal; Notable for the following components:      Result Value   Calcium, Ion 0.93 (*)    Hemoglobin 11.9 (*)    HCT 35.0 (*)    All other components within normal limits    EKG None  Radiology CT ANGIO HEAD NECK W WO CM  Result Date: 06/30/2023 CLINICAL DATA:  Headache EXAM: CT ANGIOGRAPHY HEAD AND NECK WITH AND  WITHOUT CONTRAST TECHNIQUE: Multidetector CT imaging of the head and neck was performed using the standard protocol during bolus administration of intravenous contrast. Multiplanar CT image reconstructions and MIPs were obtained to evaluate the vascular anatomy. Carotid stenosis measurements (when applicable) are obtained utilizing NASCET criteria, using the distal internal carotid diameter as the denominator. RADIATION DOSE REDUCTION: This exam was performed according to the departmental dose-optimization program which includes automated exposure control,  adjustment of the mA and/or kV according to patient size and/or use of iterative reconstruction technique. CONTRAST:  75mL OMNIPAQUE IOHEXOL 350 MG/ML SOLN COMPARISON:  None Available. FINDINGS: CT HEAD FINDINGS Brain: There is no acute intracranial hemorrhage, extra-axial fluid collection, or acute infarct. Parenchymal volume is normal. The ventricles are normal in size. Gray-white differentiation is preserved. The pituitary and suprasellar region are normal. There is no mass lesion. There is no mass effect or midline shift. Vascular: See below. Skull: Normal. Negative for fracture or focal lesion. Sinuses/Orbits: There is mild mucosal thickening throughout the paranasal sinuses with layering fluid in the sphenoid sinuses. The globes and orbits are unremarkable. Other: The mastoid air cells and middle ear cavities are clear. Review of the MIP images confirms the above findings CTA NECK FINDINGS Aortic arch: There is a minimal plaque in the imaged aortic arch. The origins of the major branch vessels are patent. The subclavian arteries are patent to the level imaged. Right carotid system: The right common, internal, and external carotid arteries are patent, without hemodynamically significant stenosis or occlusion. There is no evidence of dissection or aneurysm. Left carotid system: The left common, internal, and external carotid arteries are patent, without  hemodynamically significant stenosis or occlusion. There is no evidence of dissection or aneurysm. Vertebral arteries: There is mild stenosis of the origin of the left vertebral artery. The vertebral arteries are otherwise patent, without hemodynamically significant stenosis or occlusion there is no evidence of dissection or aneurysm. Skeleton: There is no acute osseous abnormality or suspicious osseous lesion. There is no visible canal hematoma. Other neck: The soft tissues of the neck are unremarkable. Upper chest: The lung apices are clear. Review of the MIP images confirms the above findings CTA HEAD FINDINGS Anterior circulation: The intracranial ICAs are patent, without significant stenosis or occlusion. The bilateral MCAs and ACAS are patent, without proximal stenosis or occlusion. The anterior communicating artery is normal. There is no aneurysm or AVM. Posterior circulation: The bilateral V4 segments are patent with mild plaque on the left but no significant stenosis or occlusion. The basilar artery is patent. The major cerebellar arteries appear patent. The bilateral PCAs are patent, without proximal stenosis or occlusion. Bilateral posterior communicating arteries are identified with fetal origin on the right. There is no aneurysm or AVM. Venous sinuses: Patent. Anatomic variants: As above. Review of the MIP images confirms the above findings IMPRESSION: 1. No acute intracranial pathology. 2. Patent vasculature of the head and neck with no hemodynamically significant stenosis, occlusion, or dissection. 3. Mild mucosal thickening throughout the paranasal sinuses with layering fluid in the sphenoid sinuses which can be seen with acute sinusitis in the correct clinical setting. Electronically Signed   By: Lesia Hausen M.D.   On: 06/30/2023 17:49   DG Ankle Complete Left  Result Date: 06/30/2023 CLINICAL DATA:  Pain after fall. EXAM: LEFT ANKLE COMPLETE - 3 VIEW COMPARISON:  None Available. FINDINGS:  Artifact from the patient's sock. Soft tissue swelling about the ankle particularly medially. There is sagittally oriented nondisplaced fracture of the base of the medial malleolus. No additional fracture or dislocation. Preserved joint spaces and bone mineralization. IMPRESSION: Nondisplaced sagittally oriented fracture of the base of the medial malleolus with soft tissue swelling. Overlapping artifacts. Electronically Signed   By: Karen Kays M.D.   On: 06/30/2023 17:05    Procedures .Ortho Injury Treatment  Date/Time: 06/30/2023 9:03 PM  Performed by: Silva Bandy, PA-C Authorized by: Silva Bandy, PA-C  Consent:    Consent obtained:  Verbal   Consent given by:  Patient   Risks discussed:  Fracture, nerve damage, restricted joint movement, vascular damage, stiffness, recurrent dislocation and irreducible dislocation   Alternatives discussed:  No treatment, alternative treatment, immobilization, referral and delayed treatmentInjury location: ankle Location details: left ankle Injury type: fracture Fracture type: medial malleolus Pre-procedure neurovascular assessment: neurovascularly intact Pre-procedure distal perfusion: normal Pre-procedure neurological function: normal Pre-procedure range of motion: reduced  Anesthesia: Local anesthesia used: no  Patient sedated: NoManipulation performed: no Immobilization: splint Splint type: ankle stirrup Splint Applied by: Milon Dikes Post-procedure neurovascular assessment: post-procedure neurovascularly intact Post-procedure distal perfusion: normal Post-procedure neurological function: normal Post-procedure range of motion: unchanged       Medications Ordered in ED Medications  morphine (PF) 4 MG/ML injection 4 mg (4 mg Intravenous Given 06/30/23 1552)  ondansetron (ZOFRAN) injection 4 mg (4 mg Intravenous Given 06/30/23 1552)  iohexol (OMNIPAQUE) 350 MG/ML injection 75 mL (75 mLs Intravenous Contrast Given 06/30/23 1615)   meclizine (ANTIVERT) tablet 25 mg (25 mg Oral Given 06/30/23 1901)  fentaNYL (SUBLIMAZE) injection 50 mcg (50 mcg Intravenous Given 06/30/23 1904)    ED Course/ Medical Decision Making/ A&P                                 Medical Decision Making Amount and/or Complexity of Data Reviewed Radiology: ordered.  Risk Prescription drug management.   This patient is a 67 y.o. female who presents to the ED for concern of fall, this involves an extensive number of treatment options, and is a complaint that carries with it a high risk of complications and morbidity. The emergent differential diagnosis prior to evaluation includes, but is not limited to,  trauma, ICH, arterial injury. This is not an exhaustive differential.   Past Medical History / Co-morbidities / Social History:  has a past medical history of Anemia, Arthritis, Borderline hypertension, Carpal tunnel syndrome, bilateral, Depression, Hyperlipidemia, Hypertension, Kidney disease, chronic, stage II (mild, EGFR 60+ ml/min), Osteopenia, PONV (postoperative nausea and vomiting), Renal calculus, right, Right ureteral stone, Trigger finger, right middle finger, Ulcerative colitis (HCC), Urgency of urination, Wears glasses, and Wears partial dentures.  Additional history: Chart reviewed.  Physical Exam: Physical exam performed. The pertinent findings include: Alert and oriented and neurologically intact without focal deficits.  TTP left medial ankle, ROM limited due to pain.  Compartments soft.  Good capillary refill.  DP and PT pulses intact 2+.  Lab Tests: I ordered, and personally interpreted labs.  The pertinent results include: No acute laboratory abnormalities.   Imaging Studies: I ordered imaging studies including DG left ankle, CTA head and neck. I independently visualized and interpreted imaging which showed   DG left ankle: Nondisplaced sagittally oriented fracture of the base of the medial malleolus with soft tissue  swelling.  CTA:  1. No acute intracranial pathology. 2. Patent vasculature of the head and neck with no hemodynamically significant stenosis, occlusion, or dissection. 3. Mild mucosal thickening throughout the paranasal sinuses with layering fluid in the sphenoid sinuses which can be seen with acute sinusitis in the correct clinical setting.  I agree with the radiologist interpretation.  Patient without any symptoms to suggest acute sinusitis at this time   Cardiac Monitoring:  The patient was maintained on a cardiac monitor. Cardiac monitor showed an underlying rhythm of: sinus rhythm, no STEMI or arrhythmia. I agree with this interpretation.  Medications: I ordered medication including zofran for nausea/vomiting, morphine and fentanyl for pain.  Meclizine for dizziness.  Reevaluation of the patient after these medicines showed that the patient resolved. I have reviewed the patients home medicines and have made adjustments as needed.   Disposition: After consideration of the diagnostic results and the patients response to treatment, I feel that emergency department workup does not suggest an emergent condition requiring admission or immediate intervention beyond what has been performed at this time. The plan is: Discharge with close outpatient follow-up and orthopedic referral.  Initially, given patient's ankle fracture that is non-weightbearing and dizziness in elderly patient that lives at home alone I did strongly suggest that the patient be admitted overnight for pain control and physical therapy and Occupational Therapy, however patient was adamant that she wanted to go home instead.  She was able to get up and walk around with a walker without bearing weight on her left leg after her ankle was placed in a splint.  She was able to fully ambulate down the hall turn and come back to bed with a walker without any assistance.  She has a walker at home already.  Given this, it is reasonable  for her to manage her symptoms at home.  I have recommended the family stay with her tonight and her sister at bedside is amenable to this.  Will send for a few doses of oxycodone for pain given her ankle fracture. PDMP reviewed. I have strongly discussed the associated fall risk especially given other factors involved, patient has expressed understanding that she will only take this if she has to and agrees to limit mobility after administration of this medication before she knows how it affects her.  Will also give Zofran for nausea/vomiting.  Given Ortho referral for follow-up for fracture.  Concussion precautions discussed as well.  Evaluation and diagnostic testing in the emergency department does not suggest an emergent condition requiring admission or immediate intervention beyond what has been performed at this time.  Plan for discharge with close PCP follow-up.  Patient is understanding and amenable with plan, educated on red flag symptoms that would prompt immediate return.  Patient discharged in stable condition.  Final Clinical Impression(s) / ED Diagnoses Final diagnoses:  Fall, initial encounter  Injury of head, initial encounter  Nondisplaced fracture of medial malleolus of left tibia, initial encounter for closed fracture    Rx / DC Orders ED Discharge Orders          Ordered    oxyCODONE (OXY IR/ROXICODONE) 5 MG immediate release tablet  Every 6 hours PRN        06/30/23 2120    ondansetron (ZOFRAN-ODT) 4 MG disintegrating tablet  Every 8 hours PRN        06/30/23 2122          An After Visit Summary was printed and given to the patient.     Vear Clock 06/30/23 2128    Rozelle Logan, DO 07/15/23 1528

## 2023-06-30 NOTE — ED Notes (Signed)
Patient transported to CT 

## 2023-06-30 NOTE — Discharge Instructions (Addendum)
As we discussed, the x-ray of your left ankle did show that you have a fracture.  We have placed you in a splint that you need to wear at all times even when showering until you can schedule an appointment with orthopedics for follow-up.  Please call at your earliest convenience to schedule a follow-up appointment.  Please keep the splint dry, I recommend wrapping a garbage bag around your foot to help facilitate this.  You also cannot bear weight on this fracture and therefore you have to use a walker to get around.  I have given you a prescription for oxycodone which is a narcotic pain medication for you to take as prescribed as needed for severe pain only.  Do not drive or operate heavy machinery while taking this medication as it can be sedating and please be aware of the associated increased fall risk that comes with taking this medication specifically with your injury and dizziness you are experiencing.  You may take Tylenol and ibuprofen with this medication.   I have also given you a prescription for Zofran for you to take as prescribed as needed for any residual nausea and vomiting.  Please call your primary doctor to schedule a follow-up appointment at your earliest convenience.  You likely do have a concussion that is causing your symptoms.  I recommend that you get plenty of rest and avoid bright lights, loud music, and limit your screen time while you are symptomatic.  This is not an appropriate patient will I guess maybe that is along the proximal

## 2023-06-30 NOTE — Progress Notes (Addendum)
Orthopedic Tech Progress Note Patient Details:  Leslie Harrison 05/21/1956 [id number]  Day shift ortho tech applied splint. Spoke with patient she wants to try and use crutches but due to her having dizzy spells we suggested a walker. Patient had just gotten some medicine prior before splint was applied. MD/PA wants to see if patient could use crutches/walker  Ortho Devices Type of Ortho Device: Ace wrap, Cotton web roll, Short leg splint, Stirrup splint Ortho Device/Splint Location: LLE Ortho Device/Splint Interventions: Ordered, Application, Adjustment   Post Interventions Patient Tolerated: Well Instructions Provided: Care of device  Donald Pore 06/30/2023, 7:39 PM

## 2023-06-30 NOTE — ED Notes (Signed)
Pt's sister states they have a Navaya Wiatrek at home.

## 2023-07-05 DIAGNOSIS — S8252XA Displaced fracture of medial malleolus of left tibia, initial encounter for closed fracture: Secondary | ICD-10-CM | POA: Diagnosis not present

## 2023-07-07 DIAGNOSIS — I10 Essential (primary) hypertension: Secondary | ICD-10-CM | POA: Diagnosis not present

## 2023-07-07 DIAGNOSIS — R42 Dizziness and giddiness: Secondary | ICD-10-CM | POA: Diagnosis not present

## 2023-07-07 DIAGNOSIS — Z9989 Dependence on other enabling machines and devices: Secondary | ICD-10-CM | POA: Diagnosis not present

## 2023-07-07 DIAGNOSIS — S8255XA Nondisplaced fracture of medial malleolus of left tibia, initial encounter for closed fracture: Secondary | ICD-10-CM | POA: Diagnosis not present

## 2023-07-17 DIAGNOSIS — S8252XD Displaced fracture of medial malleolus of left tibia, subsequent encounter for closed fracture with routine healing: Secondary | ICD-10-CM | POA: Diagnosis not present

## 2023-07-31 DIAGNOSIS — S8252XD Displaced fracture of medial malleolus of left tibia, subsequent encounter for closed fracture with routine healing: Secondary | ICD-10-CM | POA: Diagnosis not present

## 2023-08-01 ENCOUNTER — Other Ambulatory Visit: Payer: Self-pay | Admitting: Family Medicine

## 2023-08-01 DIAGNOSIS — E2839 Other primary ovarian failure: Secondary | ICD-10-CM

## 2023-08-29 ENCOUNTER — Other Ambulatory Visit: Payer: Self-pay | Admitting: Nurse Practitioner

## 2023-08-29 DIAGNOSIS — E785 Hyperlipidemia, unspecified: Secondary | ICD-10-CM

## 2023-10-02 DIAGNOSIS — I700 Atherosclerosis of aorta: Secondary | ICD-10-CM | POA: Diagnosis not present

## 2023-10-02 DIAGNOSIS — Z9049 Acquired absence of other specified parts of digestive tract: Secondary | ICD-10-CM | POA: Diagnosis not present

## 2023-10-02 DIAGNOSIS — R42 Dizziness and giddiness: Secondary | ICD-10-CM | POA: Diagnosis not present

## 2023-10-02 DIAGNOSIS — Z23 Encounter for immunization: Secondary | ICD-10-CM | POA: Diagnosis not present

## 2023-10-02 DIAGNOSIS — I10 Essential (primary) hypertension: Secondary | ICD-10-CM | POA: Diagnosis not present

## 2023-10-02 DIAGNOSIS — E785 Hyperlipidemia, unspecified: Secondary | ICD-10-CM | POA: Diagnosis not present

## 2023-10-02 DIAGNOSIS — R931 Abnormal findings on diagnostic imaging of heart and coronary circulation: Secondary | ICD-10-CM | POA: Diagnosis not present

## 2023-10-02 DIAGNOSIS — M8580 Other specified disorders of bone density and structure, unspecified site: Secondary | ICD-10-CM | POA: Diagnosis not present

## 2023-10-02 DIAGNOSIS — Z0000 Encounter for general adult medical examination without abnormal findings: Secondary | ICD-10-CM | POA: Diagnosis not present

## 2023-10-16 ENCOUNTER — Ambulatory Visit: Payer: Medicare Other | Attending: Family Medicine | Admitting: Physical Therapy

## 2023-10-19 ENCOUNTER — Other Ambulatory Visit: Payer: Self-pay

## 2023-10-19 ENCOUNTER — Encounter: Payer: Self-pay | Admitting: Physical Therapy

## 2023-10-19 ENCOUNTER — Ambulatory Visit: Payer: Medicare Other | Attending: Family Medicine | Admitting: Physical Therapy

## 2023-10-19 DIAGNOSIS — R42 Dizziness and giddiness: Secondary | ICD-10-CM | POA: Diagnosis not present

## 2023-10-19 NOTE — Therapy (Signed)
 OUTPATIENT PHYSICAL THERAPY VESTIBULAR EVALUATION     Patient Name: Leslie Harrison MRN: [id number] DOB:1956-03-18, 68 y.o., female Today's Date: 10/19/2023  END OF SESSION:  PT End of Session - 10/19/23 0806     Visit Number 1    Number of Visits 9    Date for PT Re-Evaluation 11/17/23    Authorization Type [employer] Medicare    PT Start Time 0803    PT Stop Time 0851    PT Time Calculation (min) 48 min    Activity Tolerance Patient tolerated treatment well    Behavior During Therapy WFL for tasks assessed/performed             Past Medical History:  Diagnosis Date   Anemia    Arthritis    hands   Borderline hypertension    Carpal tunnel syndrome, bilateral    R > L   Depression    Hyperlipidemia    Hypertension    Kidney disease, chronic, stage II (mild, EGFR 60+ ml/min)    Osteopenia    PONV (postoperative nausea and vomiting)    Renal calculus, right    Right ureteral stone    Trigger finger, right middle finger    Ulcerative colitis (HCC)    Urgency of urination    Wears glasses    Wears partial dentures    lower   Past Surgical History:  Procedure Laterality Date   COLOSTOMY TAKEDOWN  2005   CYSTOSCOPY WITH RETROGRADE PYELOGRAM, URETEROSCOPY AND STENT PLACEMENT Right 06/10/2016   Procedure: CYSTOSCOPY WITH RETROGRADE PYELOGRAM, URETEROSCOPY AND STENT PLACEMENT;  Surgeon: Ricardo Likens, MD;  Location: [facility];  Service: Urology;  Laterality: Right;  75 MINS NEEDS DIGITAL URETEROSCOPE [phone] [id number]   HOLMIUM LASER APPLICATION Right 06/10/2016   Procedure: HOLMIUM LASER APPLICATION;  Surgeon: Ricardo Likens, MD;  Location: [facility];  Service: Urology;  Laterality: Right;   LAPAROTOMY N/A 05/12/2016   Procedure: EXPLORATORY LAPAROTOMY  AND  OMENTECTOMY;  Surgeon: Krystal Spinner, MD;  Location: [facility];  Service: General;  Laterality: N/A;   STONE EXTRACTION WITH BASKET Right 06/10/2016   Procedure: STONE EXTRACTION  WITH BASKET;  Surgeon: Ricardo Likens, MD;  Location: [facility];  Service: Urology;  Laterality: Right;   SUBTOTAL COLECTOMY W/ ILEOSTOMY  2004   in [place]   TRIGGER FINGER RELEASE Right 10/15/2020   Procedure: RELEASE TRIGGER FINGER/A-1 PULLEY RIGHT MIDDLE FINGER;  Surgeon: Murrell Kuba, MD;  Location: [facility];  Service: Orthopedics;  Laterality: Right;  IV REGIONAL FOREARM BLOCK   TUBAL LIGATION  1978   Patient Active Problem List   Diagnosis Date Noted   Precordial chest pain 09/23/2022   Abdominal pain 05/11/2016   Ureteral stone 05/11/2016   Essential hypertension 05/11/2016   HLD (hyperlipidemia) 05/11/2016   History of ulcerative colitis 05/11/2016    PCP: Dyane Anthony RAMAN, FNP  REFERRING PROVIDER: Dyane Anthony RAMAN, FNP   REFERRING DIAG: R42 (ICD-10-CM) - Dizziness and giddiness   THERAPY DIAG:  Dizziness and giddiness  ONSET DATE: 10/03/2023  Rationale for Evaluation and Treatment: Rehabilitation  SUBJECTIVE:   SUBJECTIVE STATEMENT: Back in September, I tried to move a piece of furniture, and I fell and hit back of head on the floor.  Went to ED and they cleared everything.  When I look down and come back up and when I look straight up, it brings on the dizziness, like the room is spinning.  Sometimes when I  lie down to R side, it comes on.  Have had vertigo before, but this spinning is different. Pt accompanied by: self  PERTINENT HISTORY: past medical history of Anemia, Arthritis, Borderline hypertension, Carpal tunnel syndrome, bilateral, Depression, Hyperlipidemia, Hypertension, Kidney disease, chronic, stage II (mild, EGFR 60+ ml/min), Osteopenia, PONV (postoperative nausea and vomiting), Renal calculus, right, Right ureteral stone, Trigger finger, right middle finger, Ulcerative colitis (HCC), Urgency of urination; ED visit in 06/2023 with 2 falls hitting head  PAIN:  Are you having pain? No  PRECAUTIONS: None  RED  FLAGS: None   WEIGHT BEARING RESTRICTIONS: No  FALLS: Has patient fallen in last 6 months? Yes. Number of falls 1  LIVING ENVIRONMENT: Lives with: lives alone Lives in: House/apartment Stairs: flight of steps to basement with Bilat handrails Has following equipment at home: None  PLOF: Independent  PATIENT GOALS: To get rid of dizziness   OBJECTIVE:  Note: Objective measures were completed at Evaluation unless otherwise noted.  DIAGNOSTIC FINDINGS: CTA:  1. No acute intracranial pathology.  COGNITION: Overall cognitive status: Within functional limits for tasks assessed   POSTURE:  No Significant postural limitations  Cervical ROM:    Active A/PROM (deg) eval  Flexion 50  Extension 45  Right lateral flexion   Left lateral flexion   Right rotation   Left rotation   (Blank rows = not tested)   TRANSFERS: Assistive device utilized: None  Sit to stand: Modified independence Stand to sit: Modified independence  GAIT: Gait pattern: WFL  PATIENT SURVEYS:  FOTO 67 (risk adjusted 54) at intake; predicted 64  VESTIBULAR ASSESSMENT:  GENERAL OBSERVATION: No acute distress   SYMPTOM BEHAVIOR:  Subjective history: Started after fall in September with hitting head on floor.  (See above for description).  PMHx of vertigo-spinning and sickness (can only lie on R side); has always had motion sickness  Non-Vestibular symptoms:  NA  Type of dizziness: Spinning/Vertigo and Lightheadedness/Faint  Frequency: whenever she looks up/down and fast  Duration: seconds  Aggravating factors: Induced by position change: lying down to right side and Induced by motion: looking up at the ceiling, bending down to the ground, and looking down  Relieving factors: head stationary and slow movements  Progression of symptoms: unchanged  OCULOMOTOR EXAM:  Wears bifocals  Ocular Alignment: normal  Ocular ROM: No Limitations  Spontaneous Nystagmus: absent  Gaze-Induced Nystagmus:  absent  Smooth Pursuits: intact  Saccades: intact  Convergence/Divergence: 2-3 cm   VESTIBULAR - OCULAR REFLEX:   Slow VOR: Normal  VOR Cancellation: Comment: little lightheaded  Head-Impulse Test: HIT Right: negative HIT Left: negative  Dynamic Visual Acuity:  NT   POSITIONAL TESTING: Right Dix-Hallpike: downbeating towards L-did not fatigue >1 minute Left Dix-Hallpike: no nystagmus and no symptoms, slight lightheadedness upon return to sit Right Roll Test: no nystagmus and feels swimmy headed upon sitting up Left Roll Test: no nystagmus  MOTION SENSITIVITY:  Motion Sensitivity Quotient Intensity: 0 = none, 1 = Lightheaded, 2 = Mild, 3 = Moderate, 4 = Severe, 5 = Vomiting  Intensity  1. Sitting to supine   2. Supine to L side 0  3. Supine to R side 0  4. Supine to sitting 1  5. L Hallpike-Dix 0  6. Up from L  0  7. R Hallpike-Dix 2-3  8. Up from R  0  9. Sitting, head tipped to L knee 2  10. Head up from L knee 0  11. Sitting, head tipped to R knee 0  12. Head up from R knee 0  13. Sitting head turns x5 0  14.Sitting head nods x5 0  15. In stance, 180 turn to L  0  16. In stance, 180 turn to R 0                                                                                                                                 TREATMENT DATE: 10/19/2023   Canalith Repositioning:  Semont Right Anterior: Number of Reps: 1, Response to Treatment: comment: tolerated well, and Comment: no c/o upon return to sit   PATIENT EDUCATION: Education details: PT eval findings, POC, positional vertigo (BPPV anterior canal) and rationale for treatment Person educated: Patient Education method: Explanation Education comprehension: verbalized understanding  HOME EXERCISE PROGRAM:  GOALS: Goals reviewed with patient? Yes  SHORT TERM GOALS: = LTGs  LONG TERM GOALS: Target date: 11/17/2023  Pt will be independent with HEP for improved dizziness. Baseline:  Goal status:  INITIAL  2.  Pt will report 0/10 dizziness with bed mobility. Baseline:  Goal status: INITIAL  3.  Pt will report 0/10 dizziness with bending and looking up to ceiling. Baseline:  Goal status: INITIAL  4.  Pt will maintain FOTO score of at least 64. Baseline: 67 (risk adjusted 54); predicted 64 Goal status: INITIAL   ASSESSMENT:  CLINICAL IMPRESSION: Patient is a 68 y.o. feamle who was seen today for physical therapy evaluation and treatment for dizziness.  She reports onset of dizziness after she was trying to move a piece of furniture, fell to the ground and hit the back of her head in September.  She went to ED, and was cleared of any apparent head injury, but she did have L ankle fracture in the fall.  She presents today with continued reports of dizziness (room spinning and lightheaded) with head motions up to ceiling, with looking down at ground, and with lying down to right side.  She notes these episodes clear within seconds, but they have not improved since the initial incident.  She does have hx of vertigo (reports this is different) and motion sickness since a child.  Oculomotor and VOR testing is [facility] today.  With positional testing, she has downbeating nystagmus towards L when in R DH position, which does not fatigue after 1 min; this likely indicates R anterior canal BPPV.  Treated with Semont, which pt tolerates well.  She is symptomatic in both positions, but it clears and she has no symptoms upon returning to sit EOM.  Will reassess next visit and treat for any residual BPPV.  OBJECTIVE IMPAIRMENTS: dizziness.   ACTIVITY LIMITATIONS: bending and bed mobility  PARTICIPATION LIMITATIONS: meal prep, cleaning, laundry, and community activity  PERSONAL FACTORS: 3+ comorbidities: see above  are also affecting patient's functional outcome.   REHAB POTENTIAL: Good  CLINICAL DECISION MAKING: Stable/uncomplicated  EVALUATION COMPLEXITY: Low   PLAN:  PT FREQUENCY:  1-2x/week  PT  DURATION: 4 weeks  PLANNED INTERVENTIONS: 97110-Therapeutic exercises, 97530- Therapeutic activity, [id number]- Neuromuscular re-education, 97535- Self Care, 02859- Manual therapy, [id number]- Canalith repositioning, Patient/Family education, Balance training, and Vestibular training  PLAN FOR NEXT SESSION: Reassess for positional vertigo and treat as appropriate.   STARLET GREIG ORN., PT 10/19/2023, 10:09 AM   [facility] at [facility] [address] [place], [address] [place], [place] [address] Phone # [phone] Fax # [phone]

## 2023-10-25 ENCOUNTER — Ambulatory Visit: Payer: Medicare Other | Admitting: Physical Therapy

## 2023-10-25 ENCOUNTER — Encounter: Payer: Self-pay | Admitting: Physical Therapy

## 2023-10-25 DIAGNOSIS — R42 Dizziness and giddiness: Secondary | ICD-10-CM

## 2023-10-25 NOTE — Therapy (Signed)
 OUTPATIENT PHYSICAL THERAPY VESTIBULAR TREATMENT NOTE     Patient Name: Leslie Harrison MRN: [id number] DOB:04-17-1956, 68 y.o., female Today's Date: 10/25/2023  END OF SESSION:  PT End of Session - 10/25/23 0846     Visit Number 2    Number of Visits 9    Date for PT Re-Evaluation 11/17/23    Authorization Type [employer] Medicare    PT Start Time 0848    PT Stop Time 0919    PT Time Calculation (min) 31 min    Activity Tolerance Patient tolerated treatment well    Behavior During Therapy WFL for tasks assessed/performed              Past Medical History:  Diagnosis Date   Anemia    Arthritis    hands   Borderline hypertension    Carpal tunnel syndrome, bilateral    R > L   Depression    Hyperlipidemia    Hypertension    Kidney disease, chronic, stage II (mild, EGFR 60+ ml/min)    Osteopenia    PONV (postoperative nausea and vomiting)    Renal calculus, right    Right ureteral stone    Trigger finger, right middle finger    Ulcerative colitis (HCC)    Urgency of urination    Wears glasses    Wears partial dentures    lower   Past Surgical History:  Procedure Laterality Date   COLOSTOMY TAKEDOWN  2005   CYSTOSCOPY WITH RETROGRADE PYELOGRAM, URETEROSCOPY AND STENT PLACEMENT Right 06/10/2016   Procedure: CYSTOSCOPY WITH RETROGRADE PYELOGRAM, URETEROSCOPY AND STENT PLACEMENT;  Surgeon: Ricardo Likens, MD;  Location: [facility];  Service: Urology;  Laterality: Right;  75 MINS NEEDS DIGITAL URETEROSCOPE [phone] [id number]   HOLMIUM LASER APPLICATION Right 06/10/2016   Procedure: HOLMIUM LASER APPLICATION;  Surgeon: Ricardo Likens, MD;  Location: [facility];  Service: Urology;  Laterality: Right;   LAPAROTOMY N/A 05/12/2016   Procedure: EXPLORATORY LAPAROTOMY  AND  OMENTECTOMY;  Surgeon: Krystal Spinner, MD;  Location: [facility];  Service: General;  Laterality: N/A;   STONE EXTRACTION WITH BASKET Right 06/10/2016   Procedure: STONE  EXTRACTION WITH BASKET;  Surgeon: Ricardo Likens, MD;  Location: [facility];  Service: Urology;  Laterality: Right;   SUBTOTAL COLECTOMY W/ ILEOSTOMY  2004   in [place]   TRIGGER FINGER RELEASE Right 10/15/2020   Procedure: RELEASE TRIGGER FINGER/A-1 PULLEY RIGHT MIDDLE FINGER;  Surgeon: Murrell Kuba, MD;  Location: [facility];  Service: Orthopedics;  Laterality: Right;  IV REGIONAL FOREARM BLOCK   TUBAL LIGATION  1978   Patient Active Problem List   Diagnosis Date Noted   Precordial chest pain 09/23/2022   Abdominal pain 05/11/2016   Ureteral stone 05/11/2016   Essential hypertension 05/11/2016   HLD (hyperlipidemia) 05/11/2016   History of ulcerative colitis 05/11/2016    PCP: Dyane Anthony RAMAN, FNP  REFERRING PROVIDER: Dyane Anthony RAMAN, FNP   REFERRING DIAG: R42 (ICD-10-CM) - Dizziness and giddiness   THERAPY DIAG:  Dizziness and giddiness  ONSET DATE: 10/03/2023  Rationale for Evaluation and Treatment: Rehabilitation  SUBJECTIVE:   SUBJECTIVE STATEMENT: I feel great, actually.  I have had no dizzy spells at all, even when I bent down to get my keys.  Pt accompanied by: self  PERTINENT HISTORY: past medical history of Anemia, Arthritis, Borderline hypertension, Carpal tunnel syndrome, bilateral, Depression, Hyperlipidemia, Hypertension, Kidney disease, chronic, stage II (mild, EGFR 60+ ml/min), Osteopenia, PONV (postoperative  nausea and vomiting), Renal calculus, right, Right ureteral stone, Trigger finger, right middle finger, Ulcerative colitis (HCC), Urgency of urination; ED visit in 06/2023 with 2 falls hitting head  PAIN:  Are you having pain? No  PRECAUTIONS: None  RED FLAGS: None   WEIGHT BEARING RESTRICTIONS: No  FALLS: Has patient fallen in last 6 months? Yes. Number of falls 1  LIVING ENVIRONMENT: Lives with: lives alone Lives in: House/apartment Stairs: flight of steps to basement with Bilat handrails Has following  equipment at home: None  PLOF: Independent  PATIENT GOALS: To get rid of dizziness   OBJECTIVE:    TODAY'S TREATMENT: 10/25/2023 Activity Comments  R DH Negative  L DH Negative  R roll Negative  L roll Negative  Corner balance:  Feet together/EO and EC head turns/nods Mild sway, no dizziness  FOTO 67-no reported dizziness with looking up or bending over    M-CTSIB  Condition 1: Firm Surface, EO 30 Sec, Normal Sway  Condition 2: Firm Surface, EC 30 Sec, Mild Sway  Condition 3: Foam Surface, EO 30 Sec, Mild Sway  Condition 4: Foam Surface, EC 30 Sec, Moderate Sway   Access Code: [email or web address] URL: [email or web address] Date: 10/25/2023 Prepared by: [facility] - Outpatient  Rehab - [facility]  Exercises - Romberg Stance with Head Rotation  - 1 x daily - 7 x weekly - 2 sets - 5 reps - Romberg Stance with Head Nods  - 1 x daily - 7 x weekly - 2 sets - 5 reps  PATIENT EDUCATION: Education details: HEP-see above; progress with BPPV symptoms; assessed balance systems and rationale for HEP to address vestibular system for balance Person educated: Patient Education method: Explanation, Demonstration, and Handouts Education comprehension: verbalized understanding and returned demonstration  --------------------------------------------------- Note: Objective measures were completed at Evaluation unless otherwise noted.  DIAGNOSTIC FINDINGS: CTA:  1. No acute intracranial pathology.  COGNITION: Overall cognitive status: Within functional limits for tasks assessed   POSTURE:  No Significant postural limitations  Cervical ROM:    Active A/PROM (deg) eval  Flexion 50  Extension 45  Right lateral flexion   Left lateral flexion   Right rotation   Left rotation   (Blank rows = not tested)   TRANSFERS: Assistive device utilized: None  Sit to stand: Modified independence Stand to sit: Modified independence  GAIT: Gait pattern: WFL  PATIENT SURVEYS:   FOTO 67 (risk adjusted 54) at intake; predicted 64  VESTIBULAR ASSESSMENT:  GENERAL OBSERVATION: No acute distress   SYMPTOM BEHAVIOR:  Subjective history: Started after fall in September with hitting head on floor.  (See above for description).  PMHx of vertigo-spinning and sickness (can only lie on R side); has always had motion sickness  Non-Vestibular symptoms:  NA  Type of dizziness: Spinning/Vertigo and Lightheadedness/Faint  Frequency: whenever she looks up/down and fast  Duration: seconds  Aggravating factors: Induced by position change: lying down to right side and Induced by motion: looking up at the ceiling, bending down to the ground, and looking down  Relieving factors: head stationary and slow movements  Progression of symptoms: unchanged  OCULOMOTOR EXAM:  Wears bifocals  Ocular Alignment: normal  Ocular ROM: No Limitations  Spontaneous Nystagmus: absent  Gaze-Induced Nystagmus: absent  Smooth Pursuits: intact  Saccades: intact  Convergence/Divergence: 2-3 cm   VESTIBULAR - OCULAR REFLEX:   Slow VOR: Normal  VOR Cancellation: Comment: little lightheaded  Head-Impulse Test: HIT Right: negative HIT Left: negative  Dynamic Visual Acuity:  NT  POSITIONAL TESTING: Right Dix-Hallpike: downbeating towards L-did not fatigue >1 minute Left Dix-Hallpike: no nystagmus and no symptoms, slight lightheadedness upon return to sit Right Roll Test: no nystagmus and feels swimmy headed upon sitting up Left Roll Test: no nystagmus  MOTION SENSITIVITY:  Motion Sensitivity Quotient Intensity: 0 = none, 1 = Lightheaded, 2 = Mild, 3 = Moderate, 4 = Severe, 5 = Vomiting  Intensity  1. Sitting to supine   2. Supine to L side 0  3. Supine to R side 0  4. Supine to sitting 1  5. L Hallpike-Dix 0  6. Up from L  0  7. R Hallpike-Dix 2-3  8. Up from R  0  9. Sitting, head tipped to L knee 2  10. Head up from L knee 0  11. Sitting, head tipped to R knee 0  12. Head up from R  knee 0  13. Sitting head turns x5 0  14.Sitting head nods x5 0  15. In stance, 180 turn to L  0  16. In stance, 180 turn to R 0                                                                                                                                 TREATMENT DATE: 10/19/2023   Canalith Repositioning:  Semont Right Anterior: Number of Reps: 1, Response to Treatment: comment: tolerated well, and Comment: no c/o upon return to sit   PATIENT EDUCATION: Education details: PT eval findings, POC, positional vertigo (BPPV anterior canal) and rationale for treatment Person educated: Patient Education method: Explanation Education comprehension: verbalized understanding  HOME EXERCISE PROGRAM:  GOALS: Goals reviewed with patient? Yes  SHORT TERM GOALS: = LTGs  LONG TERM GOALS: Target date: 11/17/2023  Pt will be independent with HEP for improved dizziness. Baseline:  Goal status: MET, 10/25/2023  2.  Pt will report 0/10 dizziness with bed mobility. Baseline: 0/10 Goal status: MET 10/25/2023  3.  Pt will report 0/10 dizziness with bending and looking up to ceiling. Baseline: 0/10 Goal status: MET1/05/2024  4.  Pt will maintain FOTO score of at least 64. Baseline: 67 (risk adjusted 54); predicted 64; 67 10/25/2023 Goal status: MET   ASSESSMENT:  CLINICAL IMPRESSION: Pt returns to OPPT today, reporting no dizziness since eval.  She has had no dizziness with looking up, with bending down, or with bed mobility.  Assessed for BPPV today, and no symptoms/no nystagmus noted in any positions.  Did assess MCTSIB today, with pt having moderate sway on Condition 4, indicating decreased vestibular system use for balance.  Provided HEP to address this at home.  Pt pleased with how she feels and is in agreement to hold PT for 30 days, as positional vertigo has resolved at this time.  OBJECTIVE IMPAIRMENTS: dizziness.   ACTIVITY LIMITATIONS: bending and bed mobility  PARTICIPATION  LIMITATIONS: meal prep, cleaning, laundry, and community activity  PERSONAL FACTORS: 3+ comorbidities: see  above  are also affecting patient's functional outcome.   REHAB POTENTIAL: Good  CLINICAL DECISION MAKING: Stable/uncomplicated  EVALUATION COMPLEXITY: Low   PLAN:  PT FREQUENCY: 1-2x/week  PT DURATION: 4 weeks  PLANNED INTERVENTIONS: 97110-Therapeutic exercises, 97530- Therapeutic activity, [id number]- Neuromuscular re-education, 97535- Self Care, 02859- Manual therapy, [phone]- Canalith repositioning, Patient/Family education, Balance training, and Vestibular training  PLAN FOR NEXT SESSION: Hold PT for 30 days; if pt does not return, discharge at that time.     STARLET GREIG ORN., PT 10/25/2023, 9:25 AM   [facility] Outpatient Rehab at [facility] [address] [place], [address] [place], [place] [address] Phone # [phone] Fax # [phone]

## 2023-10-27 ENCOUNTER — Ambulatory Visit: Payer: Medicare Other | Admitting: Physical Therapy

## 2023-12-04 DIAGNOSIS — E785 Hyperlipidemia, unspecified: Secondary | ICD-10-CM | POA: Diagnosis not present

## 2023-12-05 ENCOUNTER — Encounter: Payer: Self-pay | Admitting: Physical Therapy

## 2023-12-05 NOTE — Therapy (Signed)
[place] [facility] [address], [address] [place], [place], [address] Phone: [phone]   Fax:  [phone]  Patient Details  Name: Leslie Harrison MRN: [id number] Date of Birth: 1956-06-22 Referring Provider:  No ref. provider found  Encounter Date: 12/05/2023  PHYSICAL THERAPY DISCHARGE SUMMARY  Visits from Start of Care: 2  Current functional level related to goals / functional outcomes: Pt has met all LTGs and dizziness has resolved.  LONG TERM GOALS: Target date: 11/17/2023   Pt will be independent with HEP for improved dizziness. Baseline:  Goal status: MET, 10/25/2023   2.  Pt will report 0/10 dizziness with bed mobility. Baseline: 0/10 Goal status: MET 10/25/2023   3.  Pt will report 0/10 dizziness with bending and looking up to ceiling. Baseline: 0/10 Goal status: MET1/05/2024   4.  Pt will maintain FOTO score of at least 64. Baseline: 67 (risk adjusted 54); predicted 64; 67 10/25/2023 Goal status: MET   Remaining deficits: NA for this episode   Education / Equipment: Educated in rationale for BPPV treatment   Patient agrees to discharge. Patient goals were met. Patient is being discharged due to meeting the stated rehab goals.  She is pleased with the current functional level and did not return due to dizziness being resolved.   Annajulia Lewing W., PT 12/05/2023, 9:21 AM  [place] [facility] [address], [address] [place], [place], [address] Phone: [phone]   Fax:  [phone]

## 2024-03-12 ENCOUNTER — Inpatient Hospital Stay: Admission: RE | Admit: 2024-03-12 | Payer: Medicare Other | Source: Ambulatory Visit

## 2024-03-29 ENCOUNTER — Other Ambulatory Visit (HOSPITAL_BASED_OUTPATIENT_CLINIC_OR_DEPARTMENT_OTHER): Payer: Self-pay | Admitting: Obstetrics and Gynecology

## 2024-03-29 DIAGNOSIS — M8580 Other specified disorders of bone density and structure, unspecified site: Secondary | ICD-10-CM

## 2024-03-29 DIAGNOSIS — Z1231 Encounter for screening mammogram for malignant neoplasm of breast: Secondary | ICD-10-CM | POA: Diagnosis not present

## 2024-04-15 DIAGNOSIS — E785 Hyperlipidemia, unspecified: Secondary | ICD-10-CM | POA: Diagnosis not present

## 2024-04-15 DIAGNOSIS — I10 Essential (primary) hypertension: Secondary | ICD-10-CM | POA: Diagnosis not present

## 2024-05-16 DIAGNOSIS — E785 Hyperlipidemia, unspecified: Secondary | ICD-10-CM | POA: Diagnosis not present

## 2024-05-16 DIAGNOSIS — I10 Essential (primary) hypertension: Secondary | ICD-10-CM | POA: Diagnosis not present

## 2024-05-28 DIAGNOSIS — M25512 Pain in left shoulder: Secondary | ICD-10-CM | POA: Diagnosis not present

## 2024-05-28 DIAGNOSIS — I10 Essential (primary) hypertension: Secondary | ICD-10-CM | POA: Diagnosis not present

## 2024-06-16 DIAGNOSIS — E785 Hyperlipidemia, unspecified: Secondary | ICD-10-CM | POA: Diagnosis not present

## 2024-06-16 DIAGNOSIS — I10 Essential (primary) hypertension: Secondary | ICD-10-CM | POA: Diagnosis not present

## 2024-07-16 DIAGNOSIS — E785 Hyperlipidemia, unspecified: Secondary | ICD-10-CM | POA: Diagnosis not present

## 2024-07-16 DIAGNOSIS — I10 Essential (primary) hypertension: Secondary | ICD-10-CM | POA: Diagnosis not present

## 2024-08-05 DIAGNOSIS — M25512 Pain in left shoulder: Secondary | ICD-10-CM | POA: Diagnosis not present

## 2024-08-05 DIAGNOSIS — Z23 Encounter for immunization: Secondary | ICD-10-CM | POA: Diagnosis not present

## 2024-08-06 DIAGNOSIS — M25512 Pain in left shoulder: Secondary | ICD-10-CM | POA: Diagnosis not present

## 2024-08-06 DIAGNOSIS — M19019 Primary osteoarthritis, unspecified shoulder: Secondary | ICD-10-CM | POA: Diagnosis not present

## 2024-09-09 ENCOUNTER — Ambulatory Visit

## 2024-09-20 ENCOUNTER — Other Ambulatory Visit: Payer: Self-pay

## 2024-09-20 ENCOUNTER — Ambulatory Visit: Attending: Family Medicine

## 2024-09-20 DIAGNOSIS — M6281 Muscle weakness (generalized): Secondary | ICD-10-CM | POA: Diagnosis present

## 2024-09-20 DIAGNOSIS — M25512 Pain in left shoulder: Secondary | ICD-10-CM | POA: Insufficient documentation

## 2024-09-20 NOTE — Therapy (Signed)
 OUTPATIENT PHYSICAL THERAPY SHOULDER EVALUATION   Patient Name: Leslie Harrison MRN: [id number] DOB:Sep 20, 1956, 68 y.o., female Today's Date: 09/20/2024  END OF SESSION:  PT End of Session - 09/20/24 0808     Visit Number 1    Number of Visits 7    Date for Recertification  11/08/24    Authorization Type [employer]    Progress Note Due on Visit 10    PT Start Time 0807    PT Stop Time [phone]    PT Time Calculation (min) 46 min          Past Medical History:  Diagnosis Date   Anemia    Arthritis    hands   Borderline hypertension    Carpal tunnel syndrome, bilateral    R > L   Depression    Hyperlipidemia    Hypertension    Kidney disease, chronic, stage II (mild, EGFR 60+ ml/min)    Osteopenia    PONV (postoperative nausea and vomiting)    Renal calculus, right    Right ureteral stone    Trigger finger, right middle finger    Ulcerative colitis (HCC)    Urgency of urination    Wears glasses    Wears partial dentures    lower   Past Surgical History:  Procedure Laterality Date   COLOSTOMY TAKEDOWN  2005   CYSTOSCOPY WITH RETROGRADE PYELOGRAM, URETEROSCOPY AND STENT PLACEMENT Right 06/10/2016   Procedure: CYSTOSCOPY WITH RETROGRADE PYELOGRAM, URETEROSCOPY AND STENT PLACEMENT;  Surgeon: Ricardo Likens, MD;  Location: [facility];  Service: Urology;  Laterality: Right;  75 MINS NEEDS DIGITAL URETEROSCOPE [phone] [id number]   HOLMIUM LASER APPLICATION Right 06/10/2016   Procedure: HOLMIUM LASER APPLICATION;  Surgeon: Ricardo Likens, MD;  Location: [facility];  Service: Urology;  Laterality: Right;   LAPAROTOMY N/A 05/12/2016   Procedure: EXPLORATORY LAPAROTOMY  AND  OMENTECTOMY;  Surgeon: Krystal Spinner, MD;  Location: [facility];  Service: General;  Laterality: N/A;   STONE EXTRACTION WITH BASKET Right 06/10/2016   Procedure: STONE EXTRACTION WITH BASKET;  Surgeon: Ricardo Likens, MD;  Location: [facility];   Service: Urology;  Laterality: Right;   SUBTOTAL COLECTOMY W/ ILEOSTOMY  2004   in [place]   TRIGGER FINGER RELEASE Right 10/15/2020   Procedure: RELEASE TRIGGER FINGER/A-1 PULLEY RIGHT MIDDLE FINGER;  Surgeon: Murrell Kuba, MD;  Location: [facility];  Service: Orthopedics;  Laterality: Right;  IV REGIONAL FOREARM BLOCK   TUBAL LIGATION  1978   Patient Active Problem List   Diagnosis Date Noted   Precordial chest pain 09/23/2022   Abdominal pain 05/11/2016   Ureteral stone 05/11/2016   Essential hypertension 05/11/2016   HLD (hyperlipidemia) 05/11/2016   History of ulcerative colitis 05/11/2016    PCP: Ferdie Nest, PA-C  REFERRING PROVIDER: Dyane Anthony RAMAN, FNP  REFERRING DIAG:  [phone] (ICD-10-CM) - Pain in left shoulder    THERAPY DIAG:  Left shoulder pain, unspecified chronicity  Muscle weakness (generalized)  Rationale for Evaluation and Treatment: Rehabilitation  ONSET DATE: August  SUBJECTIVE:  SUBJECTIVE STATEMENT: Started noticing increased left shoulder pain in August and notices certain movements are very limited such as overhead, behind the back, and throwing motions. Notes left sidelying at night can aggravate and awaken. Notes a nagging pain in the lateral left shoulder. Denies any radiating/numbness tingling w/ occasional twinges to lower arm Hand dominance: Left  PERTINENT HISTORY:   PAIN:  Are you having pain? Yes: NPRS scale: 2/10 Pain location: left shoulder Pain description: ache Aggravating factors: movements Relieving factors:    PRECAUTIONS: None  RED FLAGS: None   WEIGHT BEARING RESTRICTIONS: No  FALLS:  Has patient fallen in last 6 months? No  LIVING ENVIRONMENT: Lives with: lives with their family Lives in:  House/apartment Stairs: Has following equipment at home: None  OCCUPATION: Retired. Enjoys woodworking, sewing, crafting  PLOF: Independent, difficulty with performing self-care, ADL, housekeeping from limited ROM and left shoulder pain  PATIENT GOALS:improve LUE function to PLOF  NEXT MD VISIT:   OBJECTIVE:  Note: Objective measures were completed at Evaluation unless otherwise noted.  DIAGNOSTIC FINDINGS:  Reports xray of left shoulder revealing OA    COGNITION: Overall cognitive status: Within functional limits for tasks assessed     SENSATION: WFL  POSTURE: WFL  UPPER EXTREMITY ROM:   Active ROM Right eval Left eval  Shoulder flexion  130  Shoulder extension  15  Shoulder abduction  95  Shoulder adduction    Shoulder internal rotation  40  Shoulder external rotation  45  Elbow flexion    Elbow extension    Wrist flexion    Wrist extension    Wrist ulnar deviation    Wrist radial deviation    Wrist pronation    Wrist supination    (Blank rows = not tested)  UPPER EXTREMITY MMT:  RUE: 5/5 resisted tests LUE: 3+/5 shoulder flexion, abduction, scaption, extension, ext rot  SHOULDER SPECIAL TESTS: Impingement tests: Neer impingement test: positive , Hawkins/Kennedy impingement test: positive , Painful arc test: positive , and O' Briens test: positive  SLAP lesions: empty/firm end-feels Instability tests: capsular restrictions  Rotator cuff assessment: Drop arm test: negative, Empty can test: positive , Full can test: positive , and Infraspinatus test: positive  Biceps assessment: Speed's test: negative  JOINT MOBILITY TESTING:  Limited in capsular pattern  PALPATION:  Tenderness to palpation infraspinatus > supraspinatus                                                                                                                             TREATMENT DATE: 09/20/24   PATIENT EDUCATION: Education details: assessment details, rationale of PT  intervention, HEP initiated Person educated: Patient Education method: Explanation and Handouts Education comprehension: verbalized understanding  HOME EXERCISE PROGRAM: Access Code: [email or web address] URL: [email or web address] Date: 09/20/2024 Prepared by: Burnard Sandifer  Exercises - Supine Shoulder Flexion Extension AAROM with Dowel  - 3-5 x weekly - 2-3 sets - 10 reps - Supine Shoulder External Rotation in 45 Degrees  Abduction AAROM with Dowel  - 3-5 x weekly - 2-3 sets - 10 reps - Shoulder External Rotation and Scapular Retraction with Resistance  - 3-5 x weekly - 2-3 sets - 10 reps - 2 sec hold  ASSESSMENT:  CLINICAL IMPRESSION: Patient is a 68 y.o. lady who was seen today for physical therapy evaluation and treatment for left shoulder pain with insidious onset since August 2025 and now notes limited ROM, left shoulder pain, difficulty with ADL and activities of interest due to deficits and limitations.  Exhibits left shoulder limited ROM in capsular pattern and pain/weakness to scaption > external rotation > abduction with difficulty in performing overhead tasks or self-care activities. Pt would benefit from PT services to address deficits to restore to PLOF   OBJECTIVE IMPAIRMENTS: decreased ROM, decreased strength, impaired flexibility, impaired UE functional use, and pain.   ACTIVITY LIMITATIONS: carrying, lifting, bathing, dressing, reach over head, and hygiene/grooming  PARTICIPATION LIMITATIONS: meal prep, cleaning, laundry, community activity, and arts/crafts  PERSONAL FACTORS: Time since onset of injury/illness/exacerbation are also affecting patient's functional outcome.   REHAB POTENTIAL: Excellent  CLINICAL DECISION MAKING: Stable/uncomplicated  EVALUATION COMPLEXITY: Low   GOALS: Goals reviewed with patient? Yes  SHORT TERM GOALS: Target date: 10/11/2024    Patient will be independent in HEP to improve functional outcomes Baseline: Goal status:  INITIAL    LONG TERM GOALS: Target date: 11/08/2024    Exhibit left shoulder flexion/abduction to 160 degrees and external rotation to 80 degrees  w/out pain to improve comfort during ADL Baseline:  Goal status: INITIAL  2.  Left shoulder strength 4/5 with ability to lift 10 lbs overhead without pain to improve household activities Baseline:  Goal status: INITIAL  3.  Improve left shoulder internal rotation to L3 to improve ability for dressing/donning jacket Baseline: left PSIS (40 degrees w/ shoulder abd) Goal status: INITIAL  4.  Quick DASH  Baseline: TBD  Goal status: INITIAL  PLAN:  PT FREQUENCY: 1x/week  PT DURATION: 6 weeks  PLANNED INTERVENTIONS: 97750- Physical Performance Testing, 97110-Therapeutic exercises, 97530- Therapeutic activity, [id number]- Neuromuscular re-education, 97535- Self Care, [id number]- Manual therapy, [id number]- Gait training, [phone]- Canalith repositioning, [id number]- Aquatic Therapy, [phone]- Electrical stimulation (unattended), [phone]- Ionotophoresis 4mg /ml Dexamethasone , 79439 (1-2 muscles), 20561 (3+ muscles)- Dry Needling, and Patient/Family education  PLAN FOR NEXT SESSION: HEP review, Quick DASH/shoulder outcome, manual therapy, HEP progressions for left shoulder ROM/strength  9:14 AM, 09/20/24 M. Kelly Markeith Jue, PT, DPT Physical Therapist- [place] Office Number: [phone]

## 2024-09-27 ENCOUNTER — Ambulatory Visit

## 2024-09-27 DIAGNOSIS — M25512 Pain in left shoulder: Secondary | ICD-10-CM

## 2024-09-27 DIAGNOSIS — M6281 Muscle weakness (generalized): Secondary | ICD-10-CM

## 2024-09-27 NOTE — Therapy (Signed)
 OUTPATIENT PHYSICAL THERAPY SHOULDER TREATMENT   Patient Name: Leslie Harrison MRN: [id number] DOB:30-Aug-1956, 68 y.o., female Today's Date: 09/27/2024  END OF SESSION:  PT End of Session - 09/27/24 0803     Visit Number 2    Number of Visits 7    Date for Recertification  11/08/24    Authorization Type [employer]    Progress Note Due on Visit 10    PT Start Time 0803    PT Stop Time 0845    PT Time Calculation (min) 42 min          Past Medical History:  Diagnosis Date   Anemia    Arthritis    hands   Borderline hypertension    Carpal tunnel syndrome, bilateral    R > L   Depression    Hyperlipidemia    Hypertension    Kidney disease, chronic, stage II (mild, EGFR 60+ ml/min)    Osteopenia    PONV (postoperative nausea and vomiting)    Renal calculus, right    Right ureteral stone    Trigger finger, right middle finger    Ulcerative colitis (HCC)    Urgency of urination    Wears glasses    Wears partial dentures    lower   Past Surgical History:  Procedure Laterality Date   COLOSTOMY TAKEDOWN  2005   CYSTOSCOPY WITH RETROGRADE PYELOGRAM, URETEROSCOPY AND STENT PLACEMENT Right 06/10/2016   Procedure: CYSTOSCOPY WITH RETROGRADE PYELOGRAM, URETEROSCOPY AND STENT PLACEMENT;  Surgeon: Ricardo Likens, MD;  Location: [facility];  Service: Urology;  Laterality: Right;  75 MINS NEEDS DIGITAL URETEROSCOPE [phone] [id number]   HOLMIUM LASER APPLICATION Right 06/10/2016   Procedure: HOLMIUM LASER APPLICATION;  Surgeon: Ricardo Likens, MD;  Location: [facility];  Service: Urology;  Laterality: Right;   LAPAROTOMY N/A 05/12/2016   Procedure: EXPLORATORY LAPAROTOMY  AND  OMENTECTOMY;  Surgeon: Krystal Spinner, MD;  Location: [facility];  Service: General;  Laterality: N/A;   STONE EXTRACTION WITH BASKET Right 06/10/2016   Procedure: STONE EXTRACTION WITH BASKET;  Surgeon: Ricardo Likens, MD;  Location: [facility];   Service: Urology;  Laterality: Right;   SUBTOTAL COLECTOMY W/ ILEOSTOMY  2004   in [place]   TRIGGER FINGER RELEASE Right 10/15/2020   Procedure: RELEASE TRIGGER FINGER/A-1 PULLEY RIGHT MIDDLE FINGER;  Surgeon: Murrell Kuba, MD;  Location: [facility];  Service: Orthopedics;  Laterality: Right;  IV REGIONAL FOREARM BLOCK   TUBAL LIGATION  1978   Patient Active Problem List   Diagnosis Date Noted   Precordial chest pain 09/23/2022   Abdominal pain 05/11/2016   Ureteral stone 05/11/2016   Essential hypertension 05/11/2016   HLD (hyperlipidemia) 05/11/2016   History of ulcerative colitis 05/11/2016    PCP: Ferdie Nest, PA-C  REFERRING PROVIDER: Dyane Anthony RAMAN, FNP  REFERRING DIAG:  [phone] (ICD-10-CM) - Pain in left shoulder    THERAPY DIAG:  Left shoulder pain, unspecified chronicity  Muscle weakness (generalized)  Rationale for Evaluation and Treatment: Rehabilitation  ONSET DATE: August  SUBJECTIVE:  SUBJECTIVE STATEMENT: Doing pretty good, seeing some improvement Hand dominance: Left  PERTINENT HISTORY:   PAIN:  Are you having pain? Yes: NPRS scale: 2/10 Pain location: left shoulder Pain description: ache Aggravating factors: movements Relieving factors:    PRECAUTIONS: None  RED FLAGS: None   WEIGHT BEARING RESTRICTIONS: No  FALLS:  Has patient fallen in last 6 months? No  LIVING ENVIRONMENT: Lives with: lives with their family Lives in: House/apartment Stairs: Has following equipment at home: None  OCCUPATION: Retired. Enjoys woodworking, sewing, crafting  PLOF: Independent, difficulty with performing self-care, ADL, housekeeping from limited ROM and left shoulder pain  PATIENT GOALS:improve LUE function to PLOF  NEXT MD VISIT:   OBJECTIVE:    TODAY'S TREATMENT: 09/27/24 Activity Comments  Shoulder AAROM review   Manual therapy Joint mobilizations left shoulder to increase ROM and decrease pain grade 2-3 inf, ant, post  Shoulder ROM 145 flexion 95 abduction 40 ER  Sidelying ER 2x10 3#  Scaption eccentric 2x10 3#  Row 2x10 Blue t-band  Bilat shoulder extension 2x10 Blue t-band    PATIENT EDUCATION: Education details: assessment details, rationale of PT intervention, HEP initiated Person educated: Patient Education method: Chief Technology Officer Education comprehension: verbalized understanding  HOME EXERCISE PROGRAM: Access Code: [email or web address] URL: [email or web address] Date: 09/20/2024 Prepared by: Burnard Sandifer  Exercises - Supine Shoulder Flexion Extension AAROM with Dowel  - 3-5 x weekly - 2-3 sets - 10 reps - Supine Shoulder External Rotation in 45 Degrees Abduction AAROM with Dowel  - 3-5 x weekly - 2-3 sets - 10 reps - Shoulder External Rotation and Scapular Retraction with Resistance  - 3-5 x weekly - 2-3 sets - 10 reps - 2 sec hold - Sidelying Shoulder ER with Towel and Dumbbell  - 3-5 x weekly - 2-3 sets - 10 reps - Scaption with Dumbbells  - 3-5 x weekly - 2-3 sets - 10 reps - Standing Row with Anchored Resistance  - 3-5 x weekly - 2-3 sets - 10 reps - Shoulder extension with resistance - Neutral  - 3-5 x weekly - 2-3 sets - 10 reps  Note: Objective measures were completed at Evaluation unless otherwise noted.  DIAGNOSTIC FINDINGS:  Reports xray of left shoulder revealing OA    COGNITION: Overall cognitive status: Within functional limits for tasks assessed     SENSATION: WFL  POSTURE: WFL  UPPER EXTREMITY ROM:   Active ROM Right eval Left eval  Shoulder flexion  130  Shoulder extension  15  Shoulder abduction  95  Shoulder adduction    Shoulder internal rotation  40  Shoulder external rotation  45  Elbow flexion    Elbow extension    Wrist flexion    Wrist extension     Wrist ulnar deviation    Wrist radial deviation    Wrist pronation    Wrist supination    (Blank rows = not tested)  UPPER EXTREMITY MMT:  RUE: 5/5 resisted tests LUE: 3+/5 shoulder flexion, abduction, scaption, extension, ext rot  SHOULDER SPECIAL TESTS: Impingement tests: Neer impingement test: positive , Hawkins/Kennedy impingement test: positive , Painful arc test: positive , and O' Briens test: positive  SLAP lesions: empty/firm end-feels Instability tests: capsular restrictions  Rotator cuff assessment: Drop arm test: negative, Empty can test: positive , Full can test: positive , and Infraspinatus test: positive  Biceps assessment: Speed's test: negative  JOINT MOBILITY TESTING:  Limited in capsular pattern  PALPATION:  Tenderness to palpation infraspinatus > supraspinatus  TREATMENT DATE: 09/20/24     ASSESSMENT:  CLINICAL IMPRESSION: Good tolerance to initial HEP and improved flexion ROM from 130-145. Progressed POC to increase strength of left shoulde/rrotator cuff for improved control and reduced pain. Joint mobilization for improved ROM and advanced HEP for scaption eccentrics and scapular strengt. Continued sessions to advance POC details  OBJECTIVE IMPAIRMENTS: decreased ROM, decreased strength, impaired flexibility, impaired UE functional use, and pain.   ACTIVITY LIMITATIONS: carrying, lifting, bathing, dressing, reach over head, and hygiene/grooming  PARTICIPATION LIMITATIONS: meal prep, cleaning, laundry, community activity, and arts/crafts  PERSONAL FACTORS: Time since onset of injury/illness/exacerbation are also affecting patient's functional outcome.   REHAB POTENTIAL: Excellent  CLINICAL DECISION MAKING: Stable/uncomplicated  EVALUATION COMPLEXITY: Low   GOALS: Goals reviewed with patient? Yes  SHORT TERM GOALS: Target  date: 10/11/2024    Patient will be independent in HEP to improve functional outcomes Baseline: Goal status: INITIAL    LONG TERM GOALS: Target date: 11/08/2024    Exhibit left shoulder flexion/abduction to 160 degrees and external rotation to 80 degrees  w/out pain to improve comfort during ADL Baseline:  Goal status: INITIAL  2.  Left shoulder strength 4/5 with ability to lift 10 lbs overhead without pain to improve household activities Baseline:  Goal status: INITIAL  3.  Improve left shoulder internal rotation to L3 to improve ability for dressing/donning jacket Baseline: left PSIS (40 degrees w/ shoulder abd) Goal status: INITIAL  4.  Quick DASH  Baseline: TBD  Goal status: INITIAL  PLAN:  PT FREQUENCY: 1x/week  PT DURATION: 6 weeks  PLANNED INTERVENTIONS: 97750- Physical Performance Testing, 97110-Therapeutic exercises, 97530- Therapeutic activity, [id number]- Neuromuscular re-education, 97535- Self Care, 02859- Manual therapy, [id number]- Gait training, [phone]- Canalith repositioning, [id number]- Aquatic Therapy, [phone]- Electrical stimulation (unattended), [phone]- Ionotophoresis 4mg /ml Dexamethasone , 79439 (1-2 muscles), 20561 (3+ muscles)- Dry Needling, and Patient/Family education  PLAN FOR NEXT SESSION: HEP review, Quick DASH/shoulder outcome, manual therapy, HEP progressions for left shoulder ROM/strength  8:06 AM, 09/27/2024 M. Kelly Liesa Tsan, PT, DPT Physical Therapist- [place] Office Number: [phone]

## 2024-10-02 NOTE — Therapy (Signed)
 OUTPATIENT PHYSICAL THERAPY SHOULDER TREATMENT   Patient Name: Leslie Harrison MRN: [id number] DOB:1955/10/21, 68 y.o., female Today's Date: 10/02/2024  END OF SESSION:    Past Medical History:  Diagnosis Date   Anemia    Arthritis    hands   Borderline hypertension    Carpal tunnel syndrome, bilateral    R > L   Depression    Hyperlipidemia    Hypertension    Kidney disease, chronic, stage II (mild, EGFR 60+ ml/min)    Osteopenia    PONV (postoperative nausea and vomiting)    Renal calculus, right    Right ureteral stone    Trigger finger, right middle finger    Ulcerative colitis (HCC)    Urgency of urination    Wears glasses    Wears partial dentures    lower   Past Surgical History:  Procedure Laterality Date   COLOSTOMY TAKEDOWN  2005   CYSTOSCOPY WITH RETROGRADE PYELOGRAM, URETEROSCOPY AND STENT PLACEMENT Right 06/10/2016   Procedure: CYSTOSCOPY WITH RETROGRADE PYELOGRAM, URETEROSCOPY AND STENT PLACEMENT;  Surgeon: Ricardo Likens, MD;  Location: [facility];  Service: Urology;  Laterality: Right;  75 MINS NEEDS DIGITAL URETEROSCOPE [phone] [id number]   HOLMIUM LASER APPLICATION Right 06/10/2016   Procedure: HOLMIUM LASER APPLICATION;  Surgeon: Ricardo Likens, MD;  Location: [facility];  Service: Urology;  Laterality: Right;   LAPAROTOMY N/A 05/12/2016   Procedure: EXPLORATORY LAPAROTOMY  AND  OMENTECTOMY;  Surgeon: Krystal Spinner, MD;  Location: [facility];  Service: General;  Laterality: N/A;   STONE EXTRACTION WITH BASKET Right 06/10/2016   Procedure: STONE EXTRACTION WITH BASKET;  Surgeon: Ricardo Likens, MD;  Location: [facility];  Service: Urology;  Laterality: Right;   SUBTOTAL COLECTOMY W/ ILEOSTOMY  2004   in [place]   TRIGGER FINGER RELEASE Right 10/15/2020   Procedure: RELEASE TRIGGER FINGER/A-1 PULLEY RIGHT MIDDLE FINGER;  Surgeon: Murrell Kuba, MD;  Location: [facility];  Service:  Orthopedics;  Laterality: Right;  IV REGIONAL FOREARM BLOCK   TUBAL LIGATION  1978   Patient Active Problem List   Diagnosis Date Noted   Precordial chest pain 09/23/2022   Abdominal pain 05/11/2016   Ureteral stone 05/11/2016   Essential hypertension 05/11/2016   HLD (hyperlipidemia) 05/11/2016   History of ulcerative colitis 05/11/2016    PCP: Ferdie Nest, PA-C  REFERRING PROVIDER: Dyane Anthony RAMAN, FNP  REFERRING DIAG:  [id number] (ICD-10-CM) - Pain in left shoulder    THERAPY DIAG:  No diagnosis found.  Rationale for Evaluation and Treatment: Rehabilitation  ONSET DATE: August  SUBJECTIVE:  SUBJECTIVE STATEMENT: Doing pretty good, seeing some improvement  Hand dominance: Left  PERTINENT HISTORY:   PAIN:  Are you having pain? Yes: NPRS scale: 2/10 Pain location: left shoulder Pain description: ache Aggravating factors: movements Relieving factors:    PRECAUTIONS: None  RED FLAGS: None   WEIGHT BEARING RESTRICTIONS: No  FALLS:  Has patient fallen in last 6 months? No  LIVING ENVIRONMENT: Lives with: lives with their family Lives in: House/apartment Stairs: Has following equipment at home: None  OCCUPATION: Retired. Enjoys woodworking, sewing, crafting  PLOF: Independent, difficulty with performing self-care, ADL, housekeeping from limited ROM and left shoulder pain  PATIENT GOALS:improve LUE function to PLOF  NEXT MD VISIT:   OBJECTIVE:     TODAY'S TREATMENT: 10/04/24 Activity Comments                        ODAY'S TREATMENT: 09/27/24 Activity Comments  Shoulder AAROM review   Manual therapy Joint mobilizations left shoulder to increase ROM and decrease pain grade 2-3 inf, ant, post  Shoulder ROM 145 flexion 95 abduction 40 ER  Sidelying ER  2x10 3#  Scaption eccentric 2x10 3#  Row 2x10 Blue t-band  Bilat shoulder extension 2x10 Blue t-band    PATIENT EDUCATION: Education details: assessment details, rationale of PT intervention, HEP initiated Person educated: Patient Education method: Chief Technology Officer Education comprehension: verbalized understanding  HOME EXERCISE PROGRAM: Access Code: [email or web address] URL: [email or web address] Date: 09/20/2024 Prepared by: Burnard Sandifer  Exercises - Supine Shoulder Flexion Extension AAROM with Dowel  - 3-5 x weekly - 2-3 sets - 10 reps - Supine Shoulder External Rotation in 45 Degrees Abduction AAROM with Dowel  - 3-5 x weekly - 2-3 sets - 10 reps - Shoulder External Rotation and Scapular Retraction with Resistance  - 3-5 x weekly - 2-3 sets - 10 reps - 2 sec hold - Sidelying Shoulder ER with Towel and Dumbbell  - 3-5 x weekly - 2-3 sets - 10 reps - Scaption with Dumbbells  - 3-5 x weekly - 2-3 sets - 10 reps - Standing Row with Anchored Resistance  - 3-5 x weekly - 2-3 sets - 10 reps - Shoulder extension with resistance - Neutral  - 3-5 x weekly - 2-3 sets - 10 reps  Note: Objective measures were completed at Evaluation unless otherwise noted.  DIAGNOSTIC FINDINGS:  Reports xray of left shoulder revealing OA    COGNITION: Overall cognitive status: Within functional limits for tasks assessed     SENSATION: WFL  POSTURE: WFL  UPPER EXTREMITY ROM:   Active ROM Right eval Left eval  Shoulder flexion  130  Shoulder extension  15  Shoulder abduction  95  Shoulder adduction    Shoulder internal rotation  40  Shoulder external rotation  45  Elbow flexion    Elbow extension    Wrist flexion    Wrist extension    Wrist ulnar deviation    Wrist radial deviation    Wrist pronation    Wrist supination    (Blank rows = not tested)  UPPER EXTREMITY MMT:  RUE: 5/5 resisted tests LUE: 3+/5 shoulder flexion, abduction, scaption, extension, ext  rot  SHOULDER SPECIAL TESTS: Impingement tests: Neer impingement test: positive , Hawkins/Kennedy impingement test: positive , Painful arc test: positive , and O' Briens test: positive  SLAP lesions: empty/firm end-feels Instability tests: capsular restrictions  Rotator cuff assessment: Drop arm test: negative, Empty can test: positive , Full can test: positive ,  and Infraspinatus test: positive  Biceps assessment: Speed's test: negative  JOINT MOBILITY TESTING:  Limited in capsular pattern  PALPATION:  Tenderness to palpation infraspinatus > supraspinatus                                                                                                                             TREATMENT DATE: 09/20/24     ASSESSMENT:  CLINICAL IMPRESSION: Good tolerance to initial HEP and improved flexion ROM from 130-145. Progressed POC to increase strength of left shoulde/rrotator cuff for improved control and reduced pain. Joint mobilization for improved ROM and advanced HEP for scaption eccentrics and scapular strengt. Continued sessions to advance POC details  OBJECTIVE IMPAIRMENTS: decreased ROM, decreased strength, impaired flexibility, impaired UE functional use, and pain.   ACTIVITY LIMITATIONS: carrying, lifting, bathing, dressing, reach over head, and hygiene/grooming  PARTICIPATION LIMITATIONS: meal prep, cleaning, laundry, community activity, and arts/crafts  PERSONAL FACTORS: Time since onset of injury/illness/exacerbation are also affecting patient's functional outcome.   REHAB POTENTIAL: Excellent  CLINICAL DECISION MAKING: Stable/uncomplicated  EVALUATION COMPLEXITY: Low   GOALS: Goals reviewed with patient? Yes  SHORT TERM GOALS: Target date: 10/11/2024    Patient will be independent in HEP to improve functional outcomes Baseline: Goal status: INITIAL    LONG TERM GOALS: Target date: 11/08/2024    Exhibit left shoulder flexion/abduction to 160 degrees and  external rotation to 80 degrees  w/out pain to improve comfort during ADL Baseline:  Goal status: INITIAL  2.  Left shoulder strength 4/5 with ability to lift 10 lbs overhead without pain to improve household activities Baseline:  Goal status: INITIAL  3.  Improve left shoulder internal rotation to L3 to improve ability for dressing/donning jacket Baseline: left PSIS (40 degrees w/ shoulder abd) Goal status: INITIAL  4.  Quick DASH  Baseline: TBD  Goal status: INITIAL  PLAN:  PT FREQUENCY: 1x/week  PT DURATION: 6 weeks  PLANNED INTERVENTIONS: 97750- Physical Performance Testing, 97110-Therapeutic exercises, 97530- Therapeutic activity, [id number]- Neuromuscular re-education, 97535- Self Care, 02859- Manual therapy, [id number]- Gait training, [phone]- Canalith repositioning, [id number]- Aquatic Therapy, [phone]- Electrical stimulation (unattended), [phone]- Ionotophoresis 4mg /ml Dexamethasone , 79439 (1-2 muscles), 20561 (3+ muscles)- Dry Needling, and Patient/Family education  PLAN FOR NEXT SESSION: HEP review, Quick DASH/shoulder outcome, manual therapy, HEP progressions for left shoulder ROM/strength  3:01 PM, 10/02/2024 M. Kelly Halpin, PT, DPT Physical Therapist- [place] Office Number: [phone]

## 2024-10-04 ENCOUNTER — Encounter: Payer: Self-pay | Admitting: Physical Therapy

## 2024-10-04 ENCOUNTER — Ambulatory Visit: Admitting: Physical Therapy

## 2024-10-04 DIAGNOSIS — M25512 Pain in left shoulder: Secondary | ICD-10-CM | POA: Diagnosis not present

## 2024-10-04 DIAGNOSIS — M6281 Muscle weakness (generalized): Secondary | ICD-10-CM

## 2024-10-09 ENCOUNTER — Other Ambulatory Visit (HOSPITAL_BASED_OUTPATIENT_CLINIC_OR_DEPARTMENT_OTHER): Payer: Self-pay | Admitting: Family Medicine

## 2024-10-09 DIAGNOSIS — E2839 Other primary ovarian failure: Secondary | ICD-10-CM

## 2024-10-11 ENCOUNTER — Ambulatory Visit

## 2024-10-11 DIAGNOSIS — M25512 Pain in left shoulder: Secondary | ICD-10-CM

## 2024-10-11 DIAGNOSIS — M6281 Muscle weakness (generalized): Secondary | ICD-10-CM

## 2024-10-11 NOTE — Therapy (Signed)
 " OUTPATIENT PHYSICAL THERAPY SHOULDER TREATMENT   Patient Name: Leslie Harrison MRN: [id number] DOB:November 03, 1955, 68 y.o., female Today's Date: 10/11/2024  END OF SESSION:  PT End of Session - 10/11/24 0803     Visit Number 4    Number of Visits 7    Date for Recertification  11/08/24    Authorization Type [employer]    Progress Note Due on Visit 10    PT Start Time 0802    PT Stop Time 0845    PT Time Calculation (min) 43 min    Activity Tolerance Patient tolerated treatment well    Behavior During Therapy WFL for tasks assessed/performed           Past Medical History:  Diagnosis Date   Anemia    Arthritis    hands   Borderline hypertension    Carpal tunnel syndrome, bilateral    R > L   Depression    Hyperlipidemia    Hypertension    Kidney disease, chronic, stage II (mild, EGFR 60+ ml/min)    Osteopenia    PONV (postoperative nausea and vomiting)    Renal calculus, right    Right ureteral stone    Trigger finger, right middle finger    Ulcerative colitis (HCC)    Urgency of urination    Wears glasses    Wears partial dentures    lower   Past Surgical History:  Procedure Laterality Date   COLOSTOMY TAKEDOWN  2005   CYSTOSCOPY WITH RETROGRADE PYELOGRAM, URETEROSCOPY AND STENT PLACEMENT Right 06/10/2016   Procedure: CYSTOSCOPY WITH RETROGRADE PYELOGRAM, URETEROSCOPY AND STENT PLACEMENT;  Surgeon: Ricardo Likens, MD;  Location: [facility];  Service: Urology;  Laterality: Right;  75 MINS NEEDS DIGITAL URETEROSCOPE [phone] [id number]   HOLMIUM LASER APPLICATION Right 06/10/2016   Procedure: HOLMIUM LASER APPLICATION;  Surgeon: Ricardo Likens, MD;  Location: [facility];  Service: Urology;  Laterality: Right;   LAPAROTOMY N/A 05/12/2016   Procedure: EXPLORATORY LAPAROTOMY  AND  OMENTECTOMY;  Surgeon: Krystal Spinner, MD;  Location: [facility];  Service: General;  Laterality: N/A;   STONE EXTRACTION WITH BASKET Right  06/10/2016   Procedure: STONE EXTRACTION WITH BASKET;  Surgeon: Ricardo Likens, MD;  Location: [facility];  Service: Urology;  Laterality: Right;   SUBTOTAL COLECTOMY W/ ILEOSTOMY  2004   in [place]   TRIGGER FINGER RELEASE Right 10/15/2020   Procedure: RELEASE TRIGGER FINGER/A-1 PULLEY RIGHT MIDDLE FINGER;  Surgeon: Murrell Kuba, MD;  Location: [facility];  Service: Orthopedics;  Laterality: Right;  IV REGIONAL FOREARM BLOCK   TUBAL LIGATION  1978   Patient Active Problem List   Diagnosis Date Noted   Precordial chest pain 09/23/2022   Abdominal pain 05/11/2016   Ureteral stone 05/11/2016   Essential hypertension 05/11/2016   HLD (hyperlipidemia) 05/11/2016   History of ulcerative colitis 05/11/2016    PCP: Ferdie Nest, PA-C  REFERRING PROVIDER: Dyane Anthony RAMAN, FNP  REFERRING DIAG:  [phone] (ICD-10-CM) - Pain in left shoulder    THERAPY DIAG:  Left shoulder pain, unspecified chronicity  Muscle weakness (generalized)  Rationale for Evaluation and Treatment: Rehabilitation  ONSET DATE: August  SUBJECTIVE:  SUBJECTIVE STATEMENT: Shoulder feels pretty good, stiff.   Hand dominance: Left  PERTINENT HISTORY:   PAIN:  Are you having pain? Yes: NPRS scale: 1/10 Pain location: left shoulder Pain description: ache Aggravating factors: movements Relieving factors:    PRECAUTIONS: None  RED FLAGS: None   WEIGHT BEARING RESTRICTIONS: No  FALLS:  Has patient fallen in last 6 months? No  LIVING ENVIRONMENT: Lives with: lives with their family Lives in: House/apartment Stairs: Has following equipment at home: None  OCCUPATION: Retired. Enjoys woodworking, sewing, crafting  PLOF: Independent, difficulty with performing self-care, ADL, housekeeping  from limited ROM and left shoulder pain  PATIENT GOALS:improve LUE function to PLOF  NEXT MD VISIT:   OBJECTIVE:    TODAY'S TREATMENT: 10/11/24 Activity Comments  Seated row and lat row 2x10 15-20#  Standing unilat negative row 2x10 15-20#, assist for concentric  Pain/weakness to scaption and external rotation   Wall slides 2x10   Towel stretch for ext/IR Incr pain ant/lat shoulder  Manual therapy -grade 2-3 joint mobilization left shoulder inf glide, ant glide, post. Glide, traction      TODAY'S TREATMENT: 10/04/24 Activity Comments  Quick DASH 36  grade III shoulder distraction, grade III posterior followed by shoulder PROM all directions to tolerance Tension into ER at ~28 deg. Fairly good tolerance and ROM into scaption/ABD and IR  STM to L UT, posterior delt, ER insertion site TTP posterior shoulder   sidelying shoulder ABD 5x 0#, then with 2-3# to assist in overhead ROM 2x5 C/o UT/neck pain when performing with 3# weight- provided cues to reduce compensation and dropped weight to 2#  sidelying shoulder ER 5x 0#, then with 2-3# 2x5 C/o UT/neck pain when performing with 3# weight- provided cues to reduce compensation and dropped weight to 2#  Pt reports some L shoulder soreness after completing    L UT stretch 30 To relieve pain   Sitting L shoulder ER AAROM with cane 10x To tolerance; cues to keep elbows tucked  L shoulder ABD AAROM with cane 10x  Cueing to find angle of best tolerance (slight scaption)         HOME EXERCISE PROGRAM: Access Code: [email or web address] URL: [email or web address] Date: 09/20/2024 Prepared by: Burnard Sandifer  Exercises - Supine Shoulder Flexion Extension AAROM with Dowel  - 3-5 x weekly - 2-3 sets - 10 reps - Supine Shoulder External Rotation in 45 Degrees Abduction AAROM with Dowel  - 3-5 x weekly - 2-3 sets - 10 reps - Shoulder External Rotation and Scapular Retraction with Resistance  - 3-5 x weekly - 2-3 sets - 10 reps - 2 sec  hold - Sidelying Shoulder ER with Towel and Dumbbell  - 3-5 x weekly - 2-3 sets - 10 reps - Scaption with Dumbbells  - 3-5 x weekly - 2-3 sets - 10 reps - Standing Row with Anchored Resistance  - 3-5 x weekly - 2-3 sets - 10 reps - Shoulder extension with resistance - Neutral  - 3-5 x weekly - 2-3 sets - 10 reps    Note: Objective measures were completed at Evaluation unless otherwise noted.  DIAGNOSTIC FINDINGS:  Reports xray of left shoulder revealing OA    COGNITION: Overall cognitive status: Within functional limits for tasks assessed     SENSATION: WFL  POSTURE: WFL  UPPER EXTREMITY ROM:   Active ROM Right eval Left eval  Shoulder flexion  130  Shoulder extension  15  Shoulder abduction  95  Shoulder adduction    Shoulder  internal rotation  40  Shoulder external rotation  45  Elbow flexion    Elbow extension    Wrist flexion    Wrist extension    Wrist ulnar deviation    Wrist radial deviation    Wrist pronation    Wrist supination    (Blank rows = not tested)  UPPER EXTREMITY MMT:  RUE: 5/5 resisted tests LUE: 3+/5 shoulder flexion, abduction, scaption, extension, ext rot  SHOULDER SPECIAL TESTS: Impingement tests: Neer impingement test: positive , Hawkins/Kennedy impingement test: positive , Painful arc test: positive , and O' Briens test: positive  SLAP lesions: empty/firm end-feels Instability tests: capsular restrictions  Rotator cuff assessment: Drop arm test: negative, Empty can test: positive , Full can test: positive , and Infraspinatus test: positive  Biceps assessment: Speed's test: negative  JOINT MOBILITY TESTING:  Limited in capsular pattern  PALPATION:  Tenderness to palpation infraspinatus > supraspinatus                                                                                                                             TREATMENT DATE: 09/20/24     ASSESSMENT:  CLINICAL IMPRESSION: Reports improved ability to use LUE  for aspects of self-care such as grooming hair, getting dressed. Painful arc and limited AROM still present with painful resisted tests left shoulder of scaption > external rotation and ROM limitation to left shoulder internal rotation getting to PSIS/L5.  Progressed compound resistance movements to improve scapular control/strength. Improved ROM post manual as initial left shoulder external rotation was 45 deg with pain/gaurded end-feel and following tx 70 degrees painfree external rotation. Indep with initial HEP and ready to progress for weight bearing positions  OBJECTIVE IMPAIRMENTS: decreased ROM, decreased strength, impaired flexibility, impaired UE functional use, and pain.   ACTIVITY LIMITATIONS: carrying, lifting, bathing, dressing, reach over head, and hygiene/grooming  PARTICIPATION LIMITATIONS: meal prep, cleaning, laundry, community activity, and arts/crafts  PERSONAL FACTORS: Time since onset of injury/illness/exacerbation are also affecting patient's functional outcome.   REHAB POTENTIAL: Excellent  CLINICAL DECISION MAKING: Stable/uncomplicated  EVALUATION COMPLEXITY: Low   GOALS: Goals reviewed with patient? Yes  SHORT TERM GOALS: Target date: 10/11/2024    Patient will be independent in HEP to improve functional outcomes Baseline: Goal status: MET    LONG TERM GOALS: Target date: 11/08/2024    Exhibit left shoulder flexion/abduction to 160 degrees and external rotation to 80 degrees  w/out pain to improve comfort during ADL Baseline:  Goal status: INITIAL  2.  Left shoulder strength 4/5 with ability to lift 10 lbs overhead without pain to improve household activities Baseline:  Goal status: INITIAL  3.  Improve left shoulder internal rotation to L3 to improve ability for dressing/donning jacket Baseline: left PSIS (40 degrees w/ shoulder abd) Goal status: INITIAL  4.  Patient to score at least 8 points less on Quick DASH in order to meet MCID.  Baseline:   10/04/24  Goal status: INITIAL  PLAN:  PT FREQUENCY: 1x/week  PT DURATION: 6 weeks  PLANNED INTERVENTIONS: 97750- Physical Performance Testing, 97110-Therapeutic exercises, 97530- Therapeutic activity, [id number]- Neuromuscular re-education, 97535- Self Care, 02859- Manual therapy, [id number]- Gait training, [phone]- Canalith repositioning, [id number]- Aquatic Therapy, [phone]- Electrical stimulation (unattended), [phone]- Ionotophoresis 4mg /ml Dexamethasone , 79439 (1-2 muscles), 20561 (3+ muscles)- Dry Needling, and Patient/Family education  PLAN FOR NEXT SESSION: HEP review, , manual therapy, HEP progressions for left shoulder ROM/strength, push-up w/ plus on counter?  9:43 AM, 10/11/2024 M. Kelly Nataly Pacifico, PT, DPT Physical Therapist- [place] Office Number: [phone]   "

## 2024-10-18 ENCOUNTER — Ambulatory Visit: Attending: Family Medicine

## 2024-10-18 DIAGNOSIS — M6281 Muscle weakness (generalized): Secondary | ICD-10-CM | POA: Insufficient documentation

## 2024-10-18 DIAGNOSIS — M25512 Pain in left shoulder: Secondary | ICD-10-CM | POA: Insufficient documentation

## 2024-10-25 ENCOUNTER — Ambulatory Visit

## 2024-10-25 DIAGNOSIS — M25512 Pain in left shoulder: Secondary | ICD-10-CM

## 2024-10-25 DIAGNOSIS — M6281 Muscle weakness (generalized): Secondary | ICD-10-CM

## 2024-10-25 NOTE — Therapy (Signed)
 " OUTPATIENT PHYSICAL THERAPY SHOULDER TREATMENT   Patient Name: Leslie Harrison MRN: [id number] DOB:[birth year], 68 y.o., female Today's Date: 10/25/2024  END OF SESSION:  PT End of Session - 10/25/24 0801     Visit Number 5    Number of Visits 7    Date for Recertification  11/08/24    Authorization Type [employer]    Progress Note Due on Visit 10    PT Start Time 0801    PT Stop Time 0845    PT Time Calculation (min) 44 min    Activity Tolerance Patient tolerated treatment well    Behavior During Therapy WFL for tasks assessed/performed           Past Medical History:  Diagnosis Date   Anemia    Arthritis    hands   Borderline hypertension    Carpal tunnel syndrome, bilateral    R > L   Depression    Hyperlipidemia    Hypertension    Kidney disease, chronic, stage II (mild, EGFR 60+ ml/min)    Osteopenia    PONV (postoperative nausea and vomiting)    Renal calculus, right    Right ureteral stone    Trigger finger, right middle finger    Ulcerative colitis (HCC)    Urgency of urination    Wears glasses    Wears partial dentures    lower   Past Surgical History:  Procedure Laterality Date   COLOSTOMY TAKEDOWN  2005   CYSTOSCOPY WITH RETROGRADE PYELOGRAM, URETEROSCOPY AND STENT PLACEMENT Right 06/10/2016   Procedure: CYSTOSCOPY WITH RETROGRADE PYELOGRAM, URETEROSCOPY AND STENT PLACEMENT;  Surgeon: Ricardo Likens, MD;  Location: [facility];  Service: Urology;  Laterality: Right;  75 MINS NEEDS DIGITAL URETEROSCOPE [phone] [id number]   HOLMIUM LASER APPLICATION Right 06/10/2016   Procedure: HOLMIUM LASER APPLICATION;  Surgeon: Ricardo Likens, MD;  Location: [facility];  Service: Urology;  Laterality: Right;   LAPAROTOMY N/A 05/12/2016   Procedure: EXPLORATORY LAPAROTOMY  AND  OMENTECTOMY;  Surgeon: Krystal Spinner, MD;  Location: [facility];  Service: General;  Laterality: N/A;   STONE EXTRACTION WITH BASKET Right  06/10/2016   Procedure: STONE EXTRACTION WITH BASKET;  Surgeon: Ricardo Likens, MD;  Location: [facility];  Service: Urology;  Laterality: Right;   SUBTOTAL COLECTOMY W/ ILEOSTOMY  2004   in [place]   TRIGGER FINGER RELEASE Right 10/15/2020   Procedure: RELEASE TRIGGER FINGER/A-1 PULLEY RIGHT MIDDLE FINGER;  Surgeon: Murrell Kuba, MD;  Location: [facility];  Service: Orthopedics;  Laterality: Right;  IV REGIONAL FOREARM BLOCK   TUBAL LIGATION  1978   Patient Active Problem List   Diagnosis Date Noted   Precordial chest pain 09/23/2022   Abdominal pain 05/11/2016   Ureteral stone 05/11/2016   Essential hypertension 05/11/2016   HLD (hyperlipidemia) 05/11/2016   History of ulcerative colitis 05/11/2016    PCP: Ferdie Nest, PA-C  REFERRING PROVIDER: Dyane Anthony RAMAN, FNP  REFERRING DIAG:  [phone] (ICD-10-CM) - Pain in left shoulder    THERAPY DIAG:  Left shoulder pain, unspecified chronicity  Muscle weakness (generalized)  Rationale for Evaluation and Treatment: Rehabilitation  ONSET DATE: August  SUBJECTIVE:  SUBJECTIVE STATEMENT: Shoulder is starting to feel stronger but having lasting pain with sidelying external rotation  Hand dominance: Left  PERTINENT HISTORY:   PAIN:  Are you having pain? Yes: NPRS scale: 1/10 Pain location: left shoulder Pain description: ache Aggravating factors: movements Relieving factors:    PRECAUTIONS: None  RED FLAGS: None   WEIGHT BEARING RESTRICTIONS: No  FALLS:  Has patient fallen in last 6 months? No  LIVING ENVIRONMENT: Lives with: lives with their family Lives in: House/apartment Stairs: Has following equipment at home: None  OCCUPATION: Retired. Enjoys woodworking, sewing, crafting  PLOF:  Independent, difficulty with performing self-care, ADL, housekeeping from limited ROM and left shoulder pain  PATIENT GOALS:improve LUE function to PLOF  NEXT MD VISIT:   OBJECTIVE:   TODAY'S TREATMENT: 10/25/24 Activity Comments  Pain with left shoulder resisted abduction. Empty can > full can for pain/weakness Ext rot at neutral strong and not painful but tension on affected site.    Standing unilat row 2x10 15#   External rotation walk out 5x10 sec Blue band--isometric  Push-up plus on counttop 3x5 reps            TODAY'S TREATMENT: 10/11/24 Activity Comments  Seated row and lat row 2x10 15-20#  Standing unilat negative row 2x10 15-20#, assist for concentric  Pain/weakness to scaption and external rotation   Wall slides 2x10   Towel stretch for ext/IR Incr pain ant/lat shoulder  Manual therapy -grade 2-3 joint mobilization left shoulder inf glide, ant glide, post. Glide, traction      TODAY'S TREATMENT: 10/04/24 Activity Comments  Quick DASH 36  grade III shoulder distraction, grade III posterior followed by shoulder PROM all directions to tolerance Tension into ER at ~28 deg. Fairly good tolerance and ROM into scaption/ABD and IR  STM to L UT, posterior delt, ER insertion site TTP posterior shoulder   sidelying shoulder ABD 5x 0#, then with 2-3# to assist in overhead ROM 2x5 C/o UT/neck pain when performing with 3# weight- provided cues to reduce compensation and dropped weight to 2#  sidelying shoulder ER 5x 0#, then with 2-3# 2x5 C/o UT/neck pain when performing with 3# weight- provided cues to reduce compensation and dropped weight to 2#  Pt reports some L shoulder soreness after completing    L UT stretch 30 To relieve pain   Sitting L shoulder ER AAROM with cane 10x To tolerance; cues to keep elbows tucked  L shoulder ABD AAROM with cane 10x  Cueing to find angle of best tolerance (slight scaption)         HOME EXERCISE PROGRAM: Access Code: [email or web address] URL:  [email or web address] Date: 09/20/2024 Prepared by: Burnard Sandifer  Exercises - Supine Shoulder Flexion Extension AAROM with Dowel  - 3-5 x weekly - 2-3 sets - 10 reps - Supine Shoulder External Rotation in 45 Degrees Abduction AAROM with Dowel  - 3-5 x weekly - 2-3 sets - 10 reps - Shoulder External Rotation and Scapular Retraction with Resistance  - 3-5 x weekly - 2-3 sets - 10 reps - 2 sec hold  - Scaption with Dumbbells  - 3-5 x weekly - 2-3 sets - 10 reps - Standing Row with Anchored Resistance  - 3-5 x weekly - 2-3 sets - 10 reps - Shoulder extension with resistance - Neutral  - 3-5 x weekly - 2-3 sets - 10 reps - Shoulder External Rotation Reactive Isometrics  - 3 x weekly - 5-10 reps - 5-10 sec hold - Wall Push Up with  Plus  - 3 x weekly - 1-3 sets - 5-10 reps    Note: Objective measures were completed at Evaluation unless otherwise noted.  DIAGNOSTIC FINDINGS:  Reports xray of left shoulder revealing OA    COGNITION: Overall cognitive status: Within functional limits for tasks assessed     SENSATION: WFL  POSTURE: WFL  UPPER EXTREMITY ROM:   Active ROM Right eval Left eval  Shoulder flexion  130  Shoulder extension  15  Shoulder abduction  95  Shoulder adduction    Shoulder internal rotation  40  Shoulder external rotation  45  Elbow flexion    Elbow extension    Wrist flexion    Wrist extension    Wrist ulnar deviation    Wrist radial deviation    Wrist pronation    Wrist supination    (Blank rows = not tested)  UPPER EXTREMITY MMT:  RUE: 5/5 resisted tests LUE: 3+/5 shoulder flexion, abduction, scaption, extension, ext rot  SHOULDER SPECIAL TESTS: Impingement tests: Neer impingement test: positive , Hawkins/Kennedy impingement test: positive , Painful arc test: positive , and O' Briens test: positive  SLAP lesions: empty/firm end-feels Instability tests: capsular restrictions  Rotator cuff assessment: Drop arm test: negative, Empty  can test: positive , Full can test: positive , and Infraspinatus test: positive  Biceps assessment: Speed's test: negative  JOINT MOBILITY TESTING:  Limited in capsular pattern  PALPATION:  Tenderness to palpation infraspinatus > supraspinatus                                                                                                                             TREATMENT DATE: 09/20/24     ASSESSMENT:  CLINICAL IMPRESSION: Reduced pain with LUE use and notes slight increase to behind the back and able to reach to L5 before pain/stiffness limits. Pain with heavy resistance to external rotation and instructed in dynamic isometrics to increase load tolerance.  Greater pain to empty can vs full can. Instructed in push-up plus for weightbearing and serratus anterior recruitment to improve overhead movements.  Tendereness to palpation along infraspinatus but no drop arm noted.   OBJECTIVE IMPAIRMENTS: decreased ROM, decreased strength, impaired flexibility, impaired UE functional use, and pain.   ACTIVITY LIMITATIONS: carrying, lifting, bathing, dressing, reach over head, and hygiene/grooming  PARTICIPATION LIMITATIONS: meal prep, cleaning, laundry, community activity, and arts/crafts  PERSONAL FACTORS: Time since onset of injury/illness/exacerbation are also affecting patient's functional outcome.   REHAB POTENTIAL: Excellent  CLINICAL DECISION MAKING: Stable/uncomplicated  EVALUATION COMPLEXITY: Low   GOALS: Goals reviewed with patient? Yes  SHORT TERM GOALS: Target date: 10/11/2024    Patient will be independent in HEP to improve functional outcomes Baseline: Goal status: MET    LONG TERM GOALS: Target date: 11/08/2024    Exhibit left shoulder flexion/abduction to 160 degrees and external rotation to 80 degrees  w/out pain to improve comfort during ADL Baseline:  Goal status: INITIAL  2.  Left shoulder  strength 4/5 with ability to lift 10 lbs overhead without pain  to improve household activities Baseline:  Goal status: INITIAL  3.  Improve left shoulder internal rotation to L3 to improve ability for dressing/donning jacket Baseline: left PSIS (40 degrees w/ shoulder abd) Goal status: INITIAL  4.  Patient to score at least 8 points less on Quick DASH in order to meet MCID.  Baseline:  10/04/24  Goal status: INITIAL  PLAN:  PT FREQUENCY: 1x/week  PT DURATION: 6 weeks  PLANNED INTERVENTIONS: 97750- Physical Performance Testing, 97110-Therapeutic exercises, 97530- Therapeutic activity, [id number]- Neuromuscular re-education, 97535- Self Care, [id number]- Manual therapy, [id number]- Gait training, [phone]- Canalith repositioning, [id number]- Aquatic Therapy, [phone]- Electrical stimulation (unattended), [phone]- Ionotophoresis 4mg /ml Dexamethasone , 79439 (1-2 muscles), 20561 (3+ muscles)- Dry Needling, and Patient/Family education  PLAN FOR NEXT SESSION: HEP review, ,  HEP progressions for left shoulder ROM/strength,  8:01 AM, 10/25/2024 M. Kelly Hillery Zachman, PT, DPT Physical Therapist- [place] Office Number: [phone]   "

## 2024-11-01 ENCOUNTER — Ambulatory Visit: Admitting: Physical Therapy
